# Patient Record
Sex: Female | Born: 1945
Health system: Southern US, Community
[De-identification: ages and names within clinical notes are randomized; demographics above are authoritative.]

## PROBLEM LIST (undated history)

## (undated) DIAGNOSIS — I739 Peripheral vascular disease, unspecified: Secondary | ICD-10-CM

## (undated) DIAGNOSIS — C801 Malignant (primary) neoplasm, unspecified: Secondary | ICD-10-CM

## (undated) DIAGNOSIS — J302 Other seasonal allergic rhinitis: Secondary | ICD-10-CM

## (undated) DIAGNOSIS — D649 Anemia, unspecified: Secondary | ICD-10-CM

## (undated) DIAGNOSIS — K631 Perforation of intestine (nontraumatic): Secondary | ICD-10-CM

## (undated) DIAGNOSIS — J449 Chronic obstructive pulmonary disease, unspecified: Secondary | ICD-10-CM

## (undated) DIAGNOSIS — C4491 Basal cell carcinoma of skin, unspecified: Secondary | ICD-10-CM

## (undated) DIAGNOSIS — I724 Aneurysm of artery of lower extremity: Secondary | ICD-10-CM

## (undated) DIAGNOSIS — T8859XA Other complications of anesthesia, initial encounter: Secondary | ICD-10-CM

## (undated) DIAGNOSIS — F329 Major depressive disorder, single episode, unspecified: Secondary | ICD-10-CM

## (undated) DIAGNOSIS — F32A Depression, unspecified: Secondary | ICD-10-CM

## (undated) DIAGNOSIS — K432 Incisional hernia without obstruction or gangrene: Secondary | ICD-10-CM

## (undated) DIAGNOSIS — T4145XA Adverse effect of unspecified anesthetic, initial encounter: Secondary | ICD-10-CM

## (undated) DIAGNOSIS — K5900 Constipation, unspecified: Secondary | ICD-10-CM

## (undated) HISTORY — DX: Basal cell carcinoma of skin, unspecified: C44.91

## (undated) HISTORY — DX: Malignant (primary) neoplasm, unspecified: C80.1

## (undated) HISTORY — DX: Chronic obstructive pulmonary disease, unspecified: J44.9

## (undated) HISTORY — PX: OTHER SURGICAL HISTORY: SHX169

## (undated) HISTORY — PX: HERNIA REPAIR: SHX51

## (undated) HISTORY — DX: Aneurysm of artery of lower extremity: I72.4

## (undated) HISTORY — PX: COLOSTOMY: SHX63

---

## 2003-12-14 ENCOUNTER — Inpatient Hospital Stay (HOSPITAL_COMMUNITY): Admission: EM | Admit: 2003-12-14 | Discharge: 2003-12-19 | Payer: Self-pay | Admitting: *Deleted

## 2012-05-30 ENCOUNTER — Emergency Department (HOSPITAL_COMMUNITY)
Admission: EM | Admit: 2012-05-30 | Discharge: 2012-05-30 | Disposition: A | Discharge status: Nursing Home (Includes ICF) | Payer: Self-pay | Source: Home / Self Care | Attending: Emergency Medicine | Admitting: Emergency Medicine

## 2012-05-30 ENCOUNTER — Encounter (HOSPITAL_COMMUNITY): Payer: Self-pay | Admitting: Emergency Medicine

## 2012-05-30 ENCOUNTER — Emergency Department (HOSPITAL_COMMUNITY)
Admission: EM | Admit: 2012-05-30 | Discharge: 2012-05-30 | Disposition: A | Discharge status: Home / Self Care | Payer: Self-pay | Attending: Emergency Medicine | Admitting: Emergency Medicine

## 2012-05-30 ENCOUNTER — Encounter (HOSPITAL_COMMUNITY): Payer: Self-pay | Admitting: *Deleted

## 2012-05-30 DIAGNOSIS — L03029 Acute lymphangitis of unspecified finger: Secondary | ICD-10-CM

## 2012-05-30 DIAGNOSIS — IMO0002 Reserved for concepts with insufficient information to code with codable children: Secondary | ICD-10-CM

## 2012-05-30 DIAGNOSIS — M659 Synovitis and tenosynovitis, unspecified: Secondary | ICD-10-CM

## 2012-05-30 HISTORY — DX: Perforation of intestine (nontraumatic): K63.1

## 2012-05-30 HISTORY — DX: Other seasonal allergic rhinitis: J30.2

## 2012-05-30 LAB — CBC WITH DIFFERENTIAL/PLATELET
Basophils Absolute: 0 10*3/uL (ref 0.0–0.1)
Basophils Relative: 0 % (ref 0–1)
Eosinophils Absolute: 0 10*3/uL (ref 0.0–0.7)
Hemoglobin: 14.8 g/dL (ref 12.0–15.0)
MCH: 31.8 pg (ref 26.0–34.0)
MCHC: 35.3 g/dL (ref 30.0–36.0)
Monocytes Relative: 6 % (ref 3–12)
Neutro Abs: 8 10*3/uL — ABNORMAL HIGH (ref 1.7–7.7)
Neutrophils Relative %: 76 % (ref 43–77)
Platelets: 196 10*3/uL (ref 150–400)
RDW: 12.6 % (ref 11.5–15.5)

## 2012-05-30 LAB — BASIC METABOLIC PANEL
BUN: 8 mg/dL (ref 6–23)
Chloride: 98 mEq/L (ref 96–112)
GFR calc Af Amer: >90 mL/min (ref >90)
GFR calc non Af Amer: 86 mL/min — ABNORMAL LOW (ref >90)
Potassium: 4 mEq/L (ref 3.5–5.1)

## 2012-05-30 MED ORDER — CLINDAMYCIN PHOSPHATE 600 MG/50ML IV SOLN
600.0000 mg | Freq: Once | INTRAVENOUS | Status: AC
  Administered 2012-05-30: 600 mg via INTRAVENOUS
  Filled 2012-05-30: qty 50

## 2012-05-30 MED ORDER — CLINDAMYCIN HCL 150 MG PO CAPS: 450.0000 mg | ORAL_CAPSULE | Freq: Three times a day (TID) | ORAL | Status: DC

## 2012-05-30 MED ORDER — OXYCODONE-ACETAMINOPHEN 5-325 MG PO TABS
2.0000 | ORAL_TABLET | Freq: Once | ORAL | Status: AC
  Administered 2012-05-30: 2 via ORAL
  Filled 2012-05-30: qty 2

## 2012-05-30 NOTE — ED Notes (Signed)
Clindamycin hung .

## 2012-05-30 NOTE — ED Notes (Signed)
Toe pain better after the digital block

## 2012-05-30 NOTE — ED Notes (Signed)
Pt with onset of right middle toe redness/swollen/painful x 2 weeks - swelling toes to ankle this week - left foot swollen x 2 days - right middle posterior toe with open sore/redness/swelling increased pain

## 2012-05-30 NOTE — ED Provider Notes (Signed)
History     CSN: [id number]  Arrival date & time 05/30/12  1136   First MD Initiated Contact with Patient 05/30/12 1137      Chief Complaint  Patient presents with  . Leg Swelling  . Toe Pain    (Consider location/radiation/quality/duration/timing/severity/associated sxs/prior treatment) HPI Comments: Patient presents as she is describing that about 2 weeks ago started with a swollen right middle toe that she described does not recall having sustained any injuries, falls or direct trauma to it. She describes a for the last 2 days her foot has become swollen on the lateral aspect of her foot all way down to her ankle. She denies any fevers, chills or changes in appetite. Just moving or touching her toe hurts a" whole lot".  Patient is a 66 y.o. female presenting with toe pain. The history is provided by the patient.  Toe Pain This is a recurrent problem. The current episode started more than 1 week ago. The problem occurs constantly. The problem has been gradually worsening. The symptoms are aggravated by walking ( touucching or presssure on her middle right toe). She has tried nothing for the symptoms.    Past Medical History  Diagnosis Date  . Seasonal allergies   . Perforated bowel     Past Surgical History  Procedure Date  . Hernia repair   . Colostomy   . Colostomy reversal      History reviewed. No pertinent family history.  History  Substance Use Topics  . Smoking status: Current Everyday Smoker  . Smokeless tobacco: Not on file  . Alcohol Use: No    OB History    Grav Para Term Preterm Abortions TAB SAB Ect Mult Living                  Review of Systems  Constitutional: Negative for fever and activity change.  Musculoskeletal: Positive for joint swelling. Negative for back pain, arthralgias and gait problem.  Skin: Positive for color change. Negative for pallor and wound.  Neurological: Negative for weakness and numbness.    Allergies  Review of  patient's allergies indicates no known allergies.  Home Medications   Current Outpatient Rx  Name Route Sig Dispense Refill  . ASPIRIN 325 MG PO TBEC Oral Take 325 mg by mouth daily.    Marland Kitchen OVER THE COUNTER MEDICATION  otc pain reliever unknow tylenol or ibuprofen      BP 167/79  Pulse 106  Temp 98.7 F (37.1 C) (Oral)  Resp 24  SpO2 94%  Physical Exam  Nursing note and vitals reviewed. Constitutional: She appears well-developed and well-nourished.  Musculoskeletal: She exhibits tenderness.       Feet:  Skin: No rash noted. There is erythema. No pallor.    ED Course  Procedures (including critical care time)  Labs Reviewed - No data to display No results found.   1. Felon with lymphangitis   2. Synovitis of toe       MDM  2 week history of a initially right mid toe infection (toe felon), Bolding and advancing to infectious tenosynovitis. Most likely patient will need extensive debridement of her distal toe and IV antibiotic management as I don't believe that clinically this infection is contained to her DIP only. Patient is afebrile, with moderate discomfort.        Jimmie Molly, MD 05/30/12 1311

## 2012-05-30 NOTE — ED Notes (Signed)
Pain no better since the percocet

## 2012-05-30 NOTE — ED Notes (Addendum)
Pt c/o right toe pain x 2 weeks ago. Pt presents with discoloration to right toe, pt does not recall recent injury. Pt sent from urgent care for further eval. Pt also reports bit by a cat on left foot. Pt has closed mark on top of left foot.

## 2012-05-30 NOTE — ED Notes (Addendum)
Pt reports pain to her (R) 3rd digit x2 weeks and (R) foot swelling x1 week. Pt seen at [facility] today and sent here for f/u. Pt describes her (R) toe pain as sharp and burning. Discoloration, swelling, and redness noted to (R) 3rd digit. Pt also reports (R) foot is cold "all the time." Pt also reports her cat bit her on the top of her (L) foot "a couple of days ago."

## 2012-05-30 NOTE — ED Provider Notes (Signed)
History     CSN: [id number]  Arrival date & time 05/30/12  1323   First MD Initiated Contact with Patient 05/30/12 1500      Chief Complaint  Patient presents with  . Toe Pain    (Consider location/radiation/quality/duration/timing/severity/associated sxs/prior treatment) HPI Comments: Patient reports that she has had pain and swelling of the distal portion of her right 3rd digit for the past 2 weeks, which is progressively worsening.  Area is painful to the touch.  She denies any injury or trauma.  She done anything to treat the area. She denies fever or chills.  She is able to move her toe without difficulty.  She has never had anything like this before.  She was seen at [facility] prior to arrival in the ED and was sent to the ED for possible IV antibiotics and possible debridement.    Patient is a 66 y.o. female presenting with toe pain. The history is provided by the patient.  Toe Pain Pertinent negatives include no chills, fever, nausea, numbness or vomiting.    Past Medical History  Diagnosis Date  . Seasonal allergies   . Perforated bowel     Past Surgical History  Procedure Date  . Hernia repair   . Colostomy   . Colostomy reversal      No family history on file.  History  Substance Use Topics  . Smoking status: Current Everyday Smoker  . Smokeless tobacco: Not on file  . Alcohol Use: No    OB History    Grav Para Term Preterm Abortions TAB SAB Ect Mult Living                  Review of Systems  Constitutional: Negative for fever and chills.  Gastrointestinal: Negative for nausea and vomiting.  Musculoskeletal: Negative for gait problem.  Skin: Positive for color change. Negative for wound.  Neurological: Negative for numbness.  All other systems reviewed and are negative.    Allergies  Review of patient's allergies indicates no known allergies.  Home Medications   Current Outpatient Rx  Name Route Sig Dispense Refill  . ASPIRIN 325 MG PO TBEC Oral  Take 325 mg by mouth every 4 (four) hours as needed. For pain    . OVER THE COUNTER MEDICATION Oral Take 1 tablet by mouth every 4 (four) hours as needed. otc pain reliever unknow tylenol or ibuprofen - for pain      BP 144/84  Pulse 110  Temp 99 F (37.2 C) (Oral)  Resp 18  SpO2 96%  Physical Exam  Nursing note and vitals reviewed. Constitutional: She appears well-developed and well-nourished. No distress.  HENT:  Head: Normocephalic and atraumatic.  Neck: Normal range of motion. Neck supple.  Cardiovascular: Normal rate, regular rhythm, normal heart sounds and intact distal pulses.   Pulmonary/Chest: Effort normal and breath sounds normal.  Musculoskeletal: Normal range of motion.       Full ROM of all toes on the right foot.  No pain with ROM.  No tenderness of swelling of the tendon.  Neurological: She is alert. No sensory deficit. Gait normal.       Distal sensation intact of the distal right 3rd toe  Skin: Skin is dry. She is not diaphoretic.       Erythema and swelling of the distal bottom of the right 3rd toe.  No swelling of the tendon appreciated.  Psychiatric: She has a normal mood and affect.    ED Course  Procedures (including critical care time)   Labs Reviewed  CBC WITH DIFFERENTIAL  BASIC METABOLIC PANEL   No results found.   No diagnosis found.  Patient discussed with Dr. Ranae Palms who also evaluated the patient.  INCISION AND DRAINAGE Performed by: Anne Shutter, Khamora Karan Consent: Verbal consent obtained. Risks and benefits: risks, benefits and alternatives were discussed Type: abscess Body area: right third toe  Anesthesia: Digital block  Local anesthetic: lidocaine 2% without epinephrine  Anesthetic total: 4 ml  Complexity: complex Blunt dissection to break up loculations  Drainage: none  Drainage amount: none  Patient tolerance: Patient tolerated the procedure well with no immediate complications.    MDM  Patient presents today with  pain and swelling of the distal third toe, consistent with a Felon.  Full ROM of the toe.  No pain with ROM.  Neurovascularly intact.  Patient given dose of IV Clindamycin while in the ED and discharged home with oral antibiotics.  Patient instructed to follow up with PCP.  Return precautions discussed with patient.          Pascal Lux [place], PA-C 05/31/12 1322

## 2012-05-30 NOTE — ED Notes (Signed)
Tray set up for  Surgical procedure

## 2012-05-30 NOTE — ED Notes (Signed)
Rt foot cold clammy white in color.palpable  Popiteal.  Temperature cool mid tibia then same temp as the lt leg .  She has pain there for 2 weks.  The pain id better if she dangles the foot

## 2012-05-31 NOTE — ED Provider Notes (Signed)
Medical screening examination/treatment/procedure(s) were conducted as a shared visit with non-physician practitioner(s) and myself.  I personally evaluated the patient during the encounter   Loren Racer, MD 05/31/12 1505

## 2012-06-03 ENCOUNTER — Emergency Department (HOSPITAL_COMMUNITY): Payer: Self-pay

## 2012-06-03 ENCOUNTER — Observation Stay (HOSPITAL_COMMUNITY)
Admission: EM | Admit: 2012-06-03 | Discharge: 2012-06-03 | Disposition: A | Discharge status: Home / Self Care | Payer: Self-pay | Attending: Emergency Medicine | Admitting: Emergency Medicine

## 2012-06-03 ENCOUNTER — Encounter (HOSPITAL_COMMUNITY): Payer: Self-pay | Admitting: Emergency Medicine

## 2012-06-03 DIAGNOSIS — L0390 Cellulitis, unspecified: Secondary | ICD-10-CM

## 2012-06-03 DIAGNOSIS — F17200 Nicotine dependence, unspecified, uncomplicated: Secondary | ICD-10-CM | POA: Insufficient documentation

## 2012-06-03 DIAGNOSIS — L02619 Cutaneous abscess of unspecified foot: Principal | ICD-10-CM | POA: Insufficient documentation

## 2012-06-03 DIAGNOSIS — L03039 Cellulitis of unspecified toe: Principal | ICD-10-CM | POA: Insufficient documentation

## 2012-06-03 LAB — COMPREHENSIVE METABOLIC PANEL
ALT: 17 U/L (ref 0–35)
AST: 18 U/L (ref 0–37)
Albumin: 4 g/dL (ref 3.5–5.2)
Alkaline Phosphatase: 90 U/L (ref 39–117)
CO2: 28 mEq/L (ref 19–32)
Chloride: 93 mEq/L — ABNORMAL LOW (ref 96–112)
Potassium: 3.8 mEq/L (ref 3.5–5.1)
Total Bilirubin: 0.3 mg/dL (ref 0.3–1.2)

## 2012-06-03 LAB — CBC WITH DIFFERENTIAL/PLATELET
Basophils Absolute: 0 10*3/uL (ref 0.0–0.1)
Basophils Relative: 0 % (ref 0–1)
Hemoglobin: 13.6 g/dL (ref 12.0–15.0)
Lymphocytes Relative: 20 % (ref 12–46)
MCHC: 35.3 g/dL (ref 30.0–36.0)
Monocytes Relative: 6 % (ref 3–12)
Neutro Abs: 6.1 10*3/uL (ref 1.7–7.7)
Neutrophils Relative %: 74 % (ref 43–77)
RDW: 12.3 % (ref 11.5–15.5)
WBC: 8.3 10*3/uL (ref 4.0–10.5)

## 2012-06-03 MED ORDER — ONDANSETRON HCL 4 MG/2ML IJ SOLN: 4.0000 mg | Freq: Four times a day (QID) | INTRAMUSCULAR | Status: DC | PRN

## 2012-06-03 MED ORDER — HYDROCODONE-ACETAMINOPHEN 5-325 MG PO TABS: 1.0000 | ORAL_TABLET | ORAL | Status: DC | PRN

## 2012-06-03 MED ORDER — FUROSEMIDE 20 MG PO TABS: 20.0000 mg | ORAL_TABLET | Freq: Two times a day (BID) | ORAL | Status: DC

## 2012-06-03 MED ORDER — CEFAZOLIN SODIUM 1-5 GM-% IV SOLN
1.0000 g | Freq: Once | INTRAVENOUS | Status: AC
  Administered 2012-06-03: 1 g via INTRAVENOUS
  Filled 2012-06-03: qty 50

## 2012-06-03 MED ORDER — SODIUM CHLORIDE 0.9 % IV SOLN: 20.0000 mL | INTRAVENOUS | Status: DC

## 2012-06-03 MED ORDER — ACETAMINOPHEN 325 MG PO TABS: 650.0000 mg | ORAL_TABLET | ORAL | Status: DC | PRN

## 2012-06-03 MED ORDER — MORPHINE SULFATE 4 MG/ML IJ SOLN: 4.0000 mg | INTRAMUSCULAR | Status: DC | PRN

## 2012-06-03 MED ORDER — ZOLPIDEM TARTRATE 5 MG PO TABS: 5.0000 mg | ORAL_TABLET | Freq: Every evening | ORAL | Status: DC | PRN

## 2012-06-03 MED ORDER — CEPHALEXIN 500 MG PO CAPS: 500.0000 mg | ORAL_CAPSULE | Freq: Four times a day (QID) | ORAL | Status: DC

## 2012-06-03 MED ORDER — CEFAZOLIN SODIUM 1-5 GM-% IV SOLN
1.0000 g | Freq: Three times a day (TID) | INTRAVENOUS | Status: DC
  Administered 2012-06-03: 1 g via INTRAVENOUS
  Filled 2012-06-03: qty 50

## 2012-06-03 NOTE — ED Notes (Signed)
Meal ordered

## 2012-06-03 NOTE — ED Provider Notes (Signed)
History  This chart was scribed for Anna Booze, MD by Shari Heritage. The patient was seen in room TR07C/TR07C. Patient's care was started at 1100.     CSN: [id number]  Arrival date & time 06/03/12  1100   First MD Initiated Contact with Patient 06/03/12 1148      Chief Complaint  Patient presents with  . Toe Pain    The history is provided by the patient. No language interpreter was used.    ANDREY HOOBLER is a 66 y.o. female who presents to the Emergency Department complaining of mild to moderate, right third, 4th and 5th right toe pain onset 3 weeks ago. Patient describes pain as intermittent, short and burning. There is associated redness and swelling. Patient states that the pain started in her 3rd toe and there is now pain in her 4th and 5th toes as well. She rates her current pain at 4/10, but says that at the worst it has been 10/10. Patient denies aggravating factors. She is ambulatory. Patient has been taking Clindamycin for an infection to her 3rd right toe under the nail. She says that infection has seemed unresponsive to the antibiotics. She has a history of perforated bowel. Patient is a current smoker (1 pack/day).   PCP - None   Past Medical History  Diagnosis Date  . Seasonal allergies   . Perforated bowel     Past Surgical History  Procedure Date  . Hernia repair   . Colostomy   . Colostomy reversal      History reviewed. No pertinent family history.  History  Substance Use Topics  . Smoking status: Current Everyday Smoker  . Smokeless tobacco: Not on file  . Alcohol Use: No    OB History    Grav Para Term Preterm Abortions TAB SAB Ect Mult Living                  Review of Systems  Musculoskeletal:       Positive for right toe pain.  All other systems reviewed and are negative.    Allergies  Review of patient's allergies indicates no known allergies.  Home Medications   Current Outpatient Rx  Name Route Sig Dispense Refill  .  ACETAMINOPHEN 500 MG PO TABS Oral Take 500 mg by mouth every 6 (six) hours as needed. For pain    . CLINDAMYCIN HCL 150 MG PO CAPS Oral Take 3 capsules (450 mg total) by mouth 3 (three) times daily. 90 capsule 0  . ASPIRIN 325 MG PO TBEC Oral Take 325 mg by mouth every 4 (four) hours as needed. For pain      BP 154/77  Pulse 106  Temp 98.4 F (36.9 C) (Oral)  Resp 20  SpO2 98%  Physical Exam  Constitutional: She is oriented to person, place, and time. She appears well-developed and well-nourished.  HENT:  Head: Normocephalic and atraumatic.  Musculoskeletal:       2+ pedal and pretibial edema bilaterally. Right foot has moderate erythema. Erythema and swelling present on the right 3rd, 4th and 5th toes. Right 3rd toe has a debrided blister present at the tip without drainage. Capillary refill is prompt.   Neurological: She is alert and oriented to person, place, and time.       Right ptosis.  Psychiatric: She has a normal mood and affect. Her behavior is normal.    ED Course  Procedures (including critical care time) DIAGNOSTIC STUDIES: Oxygen Saturation is 98% on room  air, normal by my interpretation.    COORDINATION OF CARE: 11:51am- Patient informed of current plan for treatment and evaluation and agrees with plan at this time. Will order an x-ray of right foot, CBC, comprehensive metabolic panel and sedimentation rate test. Will administer Ancef by IV.  Results for orders placed during the hospital encounter of 06/03/12  CBC WITH DIFFERENTIAL      Component Value Range   WBC 8.3  4.0 - 10.5 K/uL   RBC 4.31  3.87 - 5.11 MIL/uL   Hemoglobin 13.6  12.0 - 15.0 g/dL   HCT 16.1  09.6 - 04.5 %   MCV 89.3  78.0 - 100.0 fL   MCH 31.6  26.0 - 34.0 pg   MCHC 35.3  30.0 - 36.0 g/dL   RDW 40.9  81.1 - 91.4 %   Platelets 181  150 - 400 K/uL   Neutrophils Relative 74  43 - 77 %   Neutro Abs 6.1  1.7 - 7.7 K/uL   Lymphocytes Relative 20  12 - 46 %   Lymphs Abs 1.6  0.7 - 4.0 K/uL    Monocytes Relative 6  3 - 12 %   Monocytes Absolute 0.5  0.1 - 1.0 K/uL   Eosinophils Relative 0  0 - 5 %   Eosinophils Absolute 0.0  0.0 - 0.7 K/uL   Basophils Relative 0  0 - 1 %   Basophils Absolute 0.0  0.0 - 0.1 K/uL  COMPREHENSIVE METABOLIC PANEL      Component Value Range   Sodium 131 (*) 135 - 145 mEq/L   Potassium 3.8  3.5 - 5.1 mEq/L   Chloride 93 (*) 96 - 112 mEq/L   CO2 28  19 - 32 mEq/L   Glucose, Bld 103 (*) 70 - 99 mg/dL   BUN 6  6 - 23 mg/dL   Creatinine, Ser 7.82  0.50 - 1.10 mg/dL   Calcium 9.3  8.4 - 95.6 mg/dL   Total Protein 7.5  6.0 - 8.3 g/dL   Albumin 4.0  3.5 - 5.2 g/dL   AST 18  0 - 37 U/L   ALT 17  0 - 35 U/L   Alkaline Phosphatase 90  39 - 117 U/L   Total Bilirubin 0.3  0.3 - 1.2 mg/dL   GFR calc non Af Amer 87 (*) >90 mL/min   GFR calc Af Amer >90  >90 mL/min  SEDIMENTATION RATE      Component Value Range   Sed Rate 11  0 - 22 mm/hr   Dg Foot Complete Right  06/03/2012  *RADIOLOGY REPORT*  Clinical Data: Pain and swelling fourth toe  RIGHT FOOT COMPLETE - 3+ VIEW  Comparison: None.  Findings: Three views of the right foot submitted.  No acute fracture or subluxation.  Spurring noted at the base of fifth metatarsal.  No periosteal reaction or bony erosion.  IMPRESSION: No acute fracture or subluxation.  Original Report Authenticated By: Natasha Mead, M.D.      1. Cellulitis       MDM  Cellulitis in the right fourth toe in spite of recent incision and drainage of abscess. She also is demonstrating significant peripheral edema. She is felt treatment and had MRSA, so she is started on Ancef will be placed in CDU under cellulitis protocol. She'll need diuretics for her peripheral edema.      I personally performed the services described in this documentation, which was scribed in my presence. The recorded  information has been reviewed and considered.      Anna Booze, MD 06/03/12 2044

## 2012-06-03 NOTE — ED Notes (Signed)
Pt taken to xray 

## 2012-06-03 NOTE — ED Provider Notes (Signed)
Medical screening examination/treatment/procedure(s) were conducted as a shared visit with non-physician practitioner(s) and myself.  I personally evaluated the patient during the encounter   Dione Booze, MD 06/03/12 2032

## 2012-06-03 NOTE — ED Notes (Signed)
Pt ambulated to bathroom 

## 2012-06-03 NOTE — ED Notes (Signed)
Pt c/o increasing pain in 3rd digit on right foot; pt sts taking antibiotics

## 2012-06-03 NOTE — ED Provider Notes (Signed)
The patient has had no complaint of pain while in CDU. She sates that she does not want to stay longer than for the second dose of antibiotics. Food is red and moderately swollen. Discussed we would observe until second dose and re-evaluate.  20:00 - foot swelling is improved. Redness is less pronounced and does not extend beyond the marked borders. Second dose Ancef is being given. Feel patient can go home on Keflex. Will give Lasix for mild peripheral edema noted by Dr. Preston Fleeting. Patient has no PCP. She will return here in 2 days for recheck of infection.   Rodena Medin, PA-C 06/03/12 2007

## 2012-06-03 NOTE — ED Notes (Signed)
Reviewed discharge inst several times.  Voiced understanding.  Needs to follow up here in 2 days stated "I will be here I don't want the police to come and get me."  Reviewed prescriptions with patient

## 2012-06-05 ENCOUNTER — Emergency Department (HOSPITAL_COMMUNITY)
Admission: EM | Admit: 2012-06-05 | Discharge: 2012-06-05 | Disposition: A | Discharge status: Home / Self Care | Payer: Self-pay | Attending: Emergency Medicine | Admitting: Emergency Medicine

## 2012-06-05 ENCOUNTER — Encounter (HOSPITAL_COMMUNITY): Payer: Self-pay | Admitting: Emergency Medicine

## 2012-06-05 DIAGNOSIS — Z5189 Encounter for other specified aftercare: Secondary | ICD-10-CM

## 2012-06-05 DIAGNOSIS — F17200 Nicotine dependence, unspecified, uncomplicated: Secondary | ICD-10-CM | POA: Insufficient documentation

## 2012-06-05 DIAGNOSIS — Z4801 Encounter for change or removal of surgical wound dressing: Secondary | ICD-10-CM | POA: Insufficient documentation

## 2012-06-05 NOTE — ED Notes (Signed)
Pt presents for re-evaluation of cellulitis to both lower legs.  Pt has remnants of skin marker with no redness noted.  No swelling noted, pt reports compliance with abx, unable to tolerate pain medication due to dizziness and nausea.

## 2012-06-05 NOTE — ED Notes (Signed)
Pt here for recheck on toes on right foot; noted to be red; pt sts taking antibiotics but sts not able to tolerate pain meds

## 2012-06-05 NOTE — ED Provider Notes (Signed)
History   This chart was scribed for Anna Kras, MD by Toya Smothers. The patient was seen in room TR05C/TR05C. Patient's care was started at 1146.  CSN: [id number]  Arrival date & time 06/05/12  1146   First MD Initiated Contact with Patient 06/05/12 1234      Chief Complaint  Patient presents with  . Wound Check   Patient is a 66 y.o. female presenting with wound check. The history is provided by the patient. No language interpreter was used.  Wound Check    Anna Barrera is a 66 y.o. female presents to the ED for wound check. Pt was diagnosed with a skin infection on week ago. Pt has been taking medication and states that pain has significantly decreased and symptoms are less prominent. Pt is feeling better. She denies fever.   Past Medical History  Diagnosis Date  . Seasonal allergies   . Perforated bowel     Past Surgical History  Procedure Date  . Hernia repair   . Colostomy   . Colostomy reversal      History reviewed. No pertinent family history.  History  Substance Use Topics  . Smoking status: Current Everyday Smoker  . Smokeless tobacco: Not on file  . Alcohol Use: No   Review of Systems  Skin:       Wound Check  All other systems reviewed and are negative.    Allergies  Vicodin  Home Medications   Current Outpatient Rx  Name Route Sig Dispense Refill  . ACETAMINOPHEN 500 MG PO TABS Oral Take 500 mg by mouth every 6 (six) hours as needed. For pain    . ASPIRIN 325 MG PO TBEC Oral Take 325 mg by mouth every 4 (four) hours as needed. For pain    . CEPHALEXIN 500 MG PO CAPS Oral Take 500 mg by mouth 4 (four) times daily.    . FUROSEMIDE 20 MG PO TABS Oral Take 20 mg by mouth 2 (two) times daily.      BP 121/92  Pulse 122  Temp 98.6 F (37 C) (Oral)  Resp 20  Ht 5\' 2"  (1.575 m)  Wt 120 lb (54.432 kg)  BMI 21.95 kg/m2  SpO2 98%  Physical Exam  Nursing note and vitals reviewed. Constitutional: She appears well-developed and  well-nourished. No distress.  HENT:  Head: Normocephalic and atraumatic.  Right Ear: External ear normal.  Left Ear: External ear normal.  Eyes: Conjunctivae are normal. Right eye exhibits no discharge. Left eye exhibits no discharge. No scleral icterus.  Neck: Neck supple. No tracheal deviation present.  Cardiovascular: Normal rate.   Pulmonary/Chest: Effort normal. No stridor. No respiratory distress.  Musculoskeletal: She exhibits no edema.       R lower extremity. Area of cellulitis denoted with marker. Erythema has receded with decreased warmth.  Neurological: She is alert. Cranial nerve deficit: no gross deficits.  Skin: Skin is warm and dry. No rash noted.  Psychiatric: She has a normal mood and affect.    ED Course  Procedures (including critical care time) DIAGNOSTIC STUDIES: Oxygen Saturation is 98% on room air, normal by my interpretation.    COORDINATION OF CARE: 1245- evaluated Pt. Pt is without distress and feeling better.   Labs Reviewed - No data to display Dg Foot Complete Right  06/03/2012  *RADIOLOGY REPORT*  Clinical Data: Pain and swelling fourth toe  RIGHT FOOT COMPLETE - 3+ VIEW  Comparison: None.  Findings: Three views of the right  foot submitted.  No acute fracture or subluxation.  Spurring noted at the base of fifth metatarsal.  No periosteal reaction or bony erosion.  IMPRESSION: No acute fracture or subluxation.  Original Report Authenticated By: Natasha Mead, M.D.     1. Visit for wound check       MDM  The patient cellulitis seems to be improving significantly. At this point we'll have her followup with her primary doctor routinely.   I personally performed the services described in this documentation, which was scribed in my presence.  The recorded information has been reviewed and considered.       Anna Kras, MD 06/05/12 [phone]

## 2012-06-09 ENCOUNTER — Encounter (HOSPITAL_COMMUNITY): Payer: Self-pay | Admitting: *Deleted

## 2012-06-09 ENCOUNTER — Emergency Department (HOSPITAL_COMMUNITY): Payer: Self-pay

## 2012-06-09 ENCOUNTER — Inpatient Hospital Stay (HOSPITAL_COMMUNITY)
Admission: EM | Admit: 2012-06-09 | Discharge: 2012-06-12 | DRG: 300 | Disposition: A | Discharge status: Home / Self Care | Payer: 59 | Attending: Family Medicine | Admitting: Family Medicine

## 2012-06-09 DIAGNOSIS — F17200 Nicotine dependence, unspecified, uncomplicated: Secondary | ICD-10-CM | POA: Diagnosis present

## 2012-06-09 DIAGNOSIS — L02619 Cutaneous abscess of unspecified foot: Secondary | ICD-10-CM | POA: Diagnosis present

## 2012-06-09 DIAGNOSIS — Z803 Family history of malignant neoplasm of breast: Secondary | ICD-10-CM

## 2012-06-09 DIAGNOSIS — Z885 Allergy status to narcotic agent status: Secondary | ICD-10-CM

## 2012-06-09 DIAGNOSIS — J309 Allergic rhinitis, unspecified: Secondary | ICD-10-CM | POA: Diagnosis present

## 2012-06-09 DIAGNOSIS — L089 Local infection of the skin and subcutaneous tissue, unspecified: Secondary | ICD-10-CM

## 2012-06-09 DIAGNOSIS — Z23 Encounter for immunization: Secondary | ICD-10-CM

## 2012-06-09 DIAGNOSIS — J4489 Other specified chronic obstructive pulmonary disease: Secondary | ICD-10-CM | POA: Diagnosis present

## 2012-06-09 DIAGNOSIS — Z7982 Long term (current) use of aspirin: Secondary | ICD-10-CM

## 2012-06-09 DIAGNOSIS — Z01810 Encounter for preprocedural cardiovascular examination: Secondary | ICD-10-CM

## 2012-06-09 DIAGNOSIS — L97509 Non-pressure chronic ulcer of other part of unspecified foot with unspecified severity: Secondary | ICD-10-CM | POA: Diagnosis present

## 2012-06-09 DIAGNOSIS — I739 Peripheral vascular disease, unspecified: Principal | ICD-10-CM | POA: Diagnosis present

## 2012-06-09 DIAGNOSIS — I75029 Atheroembolism of unspecified lower extremity: Secondary | ICD-10-CM | POA: Diagnosis present

## 2012-06-09 DIAGNOSIS — L03039 Cellulitis of unspecified toe: Secondary | ICD-10-CM | POA: Diagnosis present

## 2012-06-09 DIAGNOSIS — J449 Chronic obstructive pulmonary disease, unspecified: Secondary | ICD-10-CM | POA: Diagnosis present

## 2012-06-09 DIAGNOSIS — Z720 Tobacco use: Secondary | ICD-10-CM

## 2012-06-09 DIAGNOSIS — L03119 Cellulitis of unspecified part of limb: Secondary | ICD-10-CM

## 2012-06-09 DIAGNOSIS — I998 Other disorder of circulatory system: Secondary | ICD-10-CM | POA: Diagnosis present

## 2012-06-09 DIAGNOSIS — I771 Stricture of artery: Secondary | ICD-10-CM

## 2012-06-09 DIAGNOSIS — R509 Fever, unspecified: Secondary | ICD-10-CM

## 2012-06-09 DIAGNOSIS — Z9049 Acquired absence of other specified parts of digestive tract: Secondary | ICD-10-CM

## 2012-06-09 DIAGNOSIS — R0989 Other specified symptoms and signs involving the circulatory and respiratory systems: Secondary | ICD-10-CM | POA: Diagnosis present

## 2012-06-09 DIAGNOSIS — L98499 Non-pressure chronic ulcer of skin of other sites with unspecified severity: Principal | ICD-10-CM | POA: Diagnosis present

## 2012-06-09 LAB — POCT I-STAT, CHEM 8
Chloride: 101 mEq/L (ref 96–112)
Creatinine, Ser: 0.9 mg/dL (ref 0.50–1.10)
HCT: 40 % (ref 36.0–46.0)
Hemoglobin: 13.6 g/dL (ref 12.0–15.0)
Potassium: 3.9 mEq/L (ref 3.5–5.1)
Sodium: 140 mEq/L (ref 135–145)

## 2012-06-09 LAB — CBC WITH DIFFERENTIAL/PLATELET
Basophils Absolute: 0 10*3/uL (ref 0.0–0.1)
Basophils Relative: 1 % (ref 0–1)
Eosinophils Absolute: 0.1 10*3/uL (ref 0.0–0.7)
Eosinophils Relative: 1 % (ref 0–5)
HCT: 38.8 % (ref 36.0–46.0)
Lymphocytes Relative: 25 % (ref 12–46)
MCH: 31.1 pg (ref 26.0–34.0)
MCHC: 34 g/dL (ref 30.0–36.0)
MCV: 91.5 fL (ref 78.0–100.0)
Monocytes Absolute: 0.6 10*3/uL (ref 0.1–1.0)
RDW: 12.8 % (ref 11.5–15.5)

## 2012-06-09 MED ORDER — SODIUM CHLORIDE 0.9 % IV SOLN
INTRAVENOUS | Status: DC
  Administered 2012-06-10: via INTRAVENOUS

## 2012-06-09 MED ORDER — PIPERACILLIN-TAZOBACTAM 3.375 G IVPB
3.3750 g | Freq: Once | INTRAVENOUS | Status: AC
  Administered 2012-06-10: 3.375 g via INTRAVENOUS
  Filled 2012-06-09: qty 50

## 2012-06-09 MED ORDER — VANCOMYCIN HCL IN DEXTROSE 1-5 GM/200ML-% IV SOLN
1000.0000 mg | Freq: Once | INTRAVENOUS | Status: AC
  Administered 2012-06-10: 1000 mg via INTRAVENOUS
  Filled 2012-06-09: qty 200

## 2012-06-09 NOTE — ED Provider Notes (Signed)
History     CSN: [id number]  Arrival date & time 06/09/12  1229   First MD Initiated Contact with Patient 06/09/12 2208      Chief Complaint  Patient presents with  . Foot Pain    (Consider location/radiation/quality/duration/timing/severity/associated sxs/prior treatment) HPI Comments: Anna Barrera is a 66 y.o. Female who is here for her third visit for right toe pain, infection, swelling foot. She is currently taking Keflex. She was on clindamycin, but it was changed. She has no primary care Dr. She denies fever, chills, nausea, vomiting, chest pain, with this or dizziness. The pain is worse with standing, and it hurts to touch the right foot. There are no palliative factors.  Patient is a 66 y.o. female presenting with lower extremity pain. The history is provided by the patient.  Foot Pain    Past Medical History  Diagnosis Date  . Seasonal allergies   . Perforated bowel   . No pertinent past medical history     Past Surgical History  Procedure Date  . Hernia repair   . Colostomy   . Colostomy reversal      Family History  Problem Relation Age of Onset  . Breast cancer Mother     History  Substance Use Topics  . Smoking status: Current Everyday Smoker  . Smokeless tobacco: Not on file  . Alcohol Use: No    OB History    Grav Para Term Preterm Abortions TAB SAB Ect Mult Living                  Review of Systems  All other systems reviewed and are negative.    Allergies  Vicodin  Home Medications   No current outpatient prescriptions on file.  BP 100/45  Pulse 88  Temp 98.7 F (37.1 C) (Oral)  Resp 18  Ht 5\' 2"  (1.575 m)  Wt 120 lb (54.432 kg)  BMI 21.95 kg/m2  SpO2 95%  Physical Exam  Nursing note and vitals reviewed. Constitutional: She is oriented to person, place, and time. She appears well-developed and well-nourished.  HENT:  Head: Normocephalic and atraumatic.  Eyes: Conjunctivae and EOM are normal. Pupils are equal,  round, and reactive to light.  Neck: Normal range of motion and phonation normal. Neck supple.  Cardiovascular: Normal rate, regular rhythm and intact distal pulses.        Normal S1, S2. No murmur. No rub. There are no palpable pulses in the right foot. With Doppler interrogation: there is a biphasic posterior tibial artery pulse, but no dorsalis pedis pulse. In the right groin. There is a biphasic femoral pulse with Doppler.  Pulmonary/Chest: Effort normal and breath sounds normal. She has no wheezes. She has no rales. She exhibits no tenderness.  Abdominal: Soft. She exhibits no distension. There is no tenderness. There is no guarding.  Musculoskeletal:       Right foot is edematous. Toes 1,2,3,4, right foot have purplish discoloration, consistent with an ischemic appearance. Toes 2 and 3 have distal defects, consisting of open wound. There is no purulent drainage. There is vague discoloration, redness, of the right foot. The right calf and knee are normal.  Neurological: She is alert and oriented to person, place, and time. She has normal strength. No cranial nerve deficit. She exhibits normal muscle tone. Coordination normal.  Skin: Skin is warm and dry.  Psychiatric: She has a normal mood and affect. Her behavior is normal. Judgment and thought content normal.  ED Course  Procedures (including critical care time)  Emergency department treatment: IV fluids, IV Zosyn.   Labs Reviewed  POCT I-STAT, CHEM 8 - Abnormal; Notable for the following:    BUN 4 (*)     All other components within normal limits  COMPREHENSIVE METABOLIC PANEL - Abnormal; Notable for the following:    Glucose, Bld 134 (*)     Albumin 3.3 (*)     GFR calc non Af Amer 69 (*)     GFR calc Af Amer 80 (*)     All other components within normal limits  CBC WITH DIFFERENTIAL - Abnormal; Notable for the following:    RBC 3.78 (*)     HCT 34.6 (*)     All other components within normal limits  URINALYSIS, ROUTINE W  REFLEX MICROSCOPIC - Abnormal; Notable for the following:    Specific Gravity, Urine 1.043 (*)     Hgb urine dipstick SMALL (*)     Ketones, ur 15 (*)     Leukocytes, UA SMALL (*)     All other components within normal limits  URINE MICROSCOPIC-ADD ON - Abnormal; Notable for the following:    Squamous Epithelial / LPF FEW (*)     All other components within normal limits  CBC WITH DIFFERENTIAL  CBC  CULTURE, BLOOD (ROUTINE X 2)  CULTURE, BLOOD (ROUTINE X 2)  URINE CULTURE   Dg Chest 2 View  06/11/2012  *RADIOLOGY REPORT*  Clinical Data: Fever, crackles  CHEST - 2 VIEW  Comparison: Chest x-ray of 08/13 1013  Findings: There is minimal linear atelectasis at the right lung base.  No infiltrate or effusion is seen.  Mediastinal contours appear stable.  The heart is within normal limits in size.  No bony abnormality is seen.  Surgical clips are present in the right upper quadrant from prior cholecystectomy.  IMPRESSION: Mild linear atelectasis at the right lung base.  No active lung disease.  Original Report Authenticated By: Juline Patch, M.D.   Nm Myocar Multi W/spect W/wall Motion / Ef  06/11/2012  Clinical Data:  Tobacco use.  Preoperative assessment.  Technique:  Standard myocardial SPECT imaging performed after resting intravenous injection of Tc-12m tetrofosmin.  Subsequently, stress in the form of Lexiscan  was administered under the supervision of the Cardiology staff.  At peak stress, Tc-24m tetrofosmin was injected intravenously and standard myocardial SPECT imaging performed.  Quantitative gated imaging also performed to evaluate left ventricular wall motion and estimate left ventricular ejection fraction.  Radiopharmaceutical: Tc-74m tetrofosmin, 10 mCi at rest and 30 mCi at stress.  Comparison:  None  MYOCARDIAL IMAGING WITH SPECT (REST AND STRESS)  Findings:  Reduced inferior wall activity on stress and rest imaging may well be due to diaphragmatic attenuation although scar can have a  similar appearance.  There is considerable activity in bowel along the left hemidiaphragm, which do not clear even with oral water administration.  There is also considerable breathing motion artifact.  LEFT VENTRICULAR EJECTION FRACTION  Findings:  Left ventricular end-diastolic volume is 41 cc.  End- systolic volume is 19 cc.  Derived LV ejection fraction is 54%.  GATED LEFT VENTRICULAR WALL MOTION STUDY  Findings:  Equivocal hypokinesis along the inferolateral cardiac base noted.  IMPRESSION:  1.  Reduced inferior wall activity on stress rest imaging, probably from diaphragmatic attenuation and less likely due to scar.  No inducible ischemia observed.  Original Report Authenticated By: Dellia Cloud, M.D.  1. Foot infection   2. Arterial insufficiency with ischemic ulcer   3. Cellulitis of foot   4. Tobacco abuse   5. Pre-operative cardiovascular examination   6. Fever       MDM  Right foot infection with appearance of ischemia and diminished arterial flow, hold right leg. No cardiac irregularity. I have low suspicion for embolic etiology of infection. Patient has failed outpatient treatment and will need to be admitted for further management. She's had a formal arterial testing with ankle-brachial indexes, bilaterally.    Plan: Admit- unassigned        Flint Melter, MD 06/12/12 [phone]

## 2012-06-09 NOTE — ED Notes (Signed)
PT is here with right middle toe back at tip and redness going up leg.  Pt states the redness is new.  Pt is on antibiotics for this.  Pulse present in extremity

## 2012-06-09 NOTE — ED Notes (Signed)
Advised of the wait 

## 2012-06-10 ENCOUNTER — Encounter (HOSPITAL_COMMUNITY): Payer: Self-pay | Admitting: Internal Medicine

## 2012-06-10 ENCOUNTER — Encounter (HOSPITAL_COMMUNITY)
Admission: EM | Disposition: A | Discharge status: Home / Self Care | Payer: Self-pay | Source: Home / Self Care | Attending: Family Medicine

## 2012-06-10 ENCOUNTER — Other Ambulatory Visit (HOSPITAL_COMMUNITY): Payer: Self-pay

## 2012-06-10 ENCOUNTER — Inpatient Hospital Stay (HOSPITAL_COMMUNITY): Payer: MEDICAID

## 2012-06-10 DIAGNOSIS — I739 Peripheral vascular disease, unspecified: Secondary | ICD-10-CM

## 2012-06-10 DIAGNOSIS — M79609 Pain in unspecified limb: Secondary | ICD-10-CM

## 2012-06-10 DIAGNOSIS — R6889 Other general symptoms and signs: Secondary | ICD-10-CM

## 2012-06-10 DIAGNOSIS — Z01810 Encounter for preprocedural cardiovascular examination: Secondary | ICD-10-CM

## 2012-06-10 DIAGNOSIS — I771 Stricture of artery: Secondary | ICD-10-CM

## 2012-06-10 DIAGNOSIS — L98499 Non-pressure chronic ulcer of skin of other sites with unspecified severity: Secondary | ICD-10-CM

## 2012-06-10 DIAGNOSIS — Z720 Tobacco use: Secondary | ICD-10-CM | POA: Diagnosis present

## 2012-06-10 DIAGNOSIS — F17200 Nicotine dependence, unspecified, uncomplicated: Secondary | ICD-10-CM

## 2012-06-10 DIAGNOSIS — L02619 Cutaneous abscess of unspecified foot: Secondary | ICD-10-CM

## 2012-06-10 DIAGNOSIS — L97909 Non-pressure chronic ulcer of unspecified part of unspecified lower leg with unspecified severity: Secondary | ICD-10-CM

## 2012-06-10 DIAGNOSIS — L03119 Cellulitis of unspecified part of limb: Secondary | ICD-10-CM | POA: Diagnosis present

## 2012-06-10 HISTORY — PX: LOWER EXTREMITY ANGIOGRAM: SHX5508

## 2012-06-10 LAB — CBC WITH DIFFERENTIAL/PLATELET
Basophils Absolute: 0.1 10*3/uL (ref 0.0–0.1)
Lymphs Abs: 2 10*3/uL (ref 0.7–4.0)
MCH: 32 pg (ref 26.0–34.0)
MCV: 91.5 fL (ref 78.0–100.0)
Monocytes Absolute: 0.6 10*3/uL (ref 0.1–1.0)
Platelets: 213 10*3/uL (ref 150–400)
RDW: 13 % (ref 11.5–15.5)

## 2012-06-10 LAB — COMPREHENSIVE METABOLIC PANEL
AST: 37 U/L (ref 0–37)
Albumin: 3.3 g/dL — ABNORMAL LOW (ref 3.5–5.2)
Alkaline Phosphatase: 71 U/L (ref 39–117)
BUN: 7 mg/dL (ref 6–23)
Chloride: 102 mEq/L (ref 96–112)
Potassium: 3.7 mEq/L (ref 3.5–5.1)
Total Bilirubin: 0.4 mg/dL (ref 0.3–1.2)

## 2012-06-10 SURGERY — ANGIOGRAM, LOWER EXTREMITY
Anesthesia: LOCAL | Laterality: Left

## 2012-06-10 MED ORDER — MIDAZOLAM HCL 2 MG/2ML IJ SOLN
INTRAMUSCULAR | Status: AC
  Filled 2012-06-10: qty 2

## 2012-06-10 MED ORDER — ASPIRIN EC 81 MG PO TBEC
81.0000 mg | DELAYED_RELEASE_TABLET | Freq: Every day | ORAL | Status: DC
  Administered 2012-06-10 – 2012-06-12 (×3): 81 mg via ORAL
  Filled 2012-06-10 (×5): qty 1

## 2012-06-10 MED ORDER — LABETALOL HCL 5 MG/ML IV SOLN
10.0000 mg | INTRAVENOUS | Status: DC | PRN
  Filled 2012-06-10: qty 4

## 2012-06-10 MED ORDER — SODIUM CHLORIDE 0.9 % IV SOLN: INTRAVENOUS | Status: DC

## 2012-06-10 MED ORDER — SIMVASTATIN 20 MG PO TABS
20.0000 mg | ORAL_TABLET | Freq: Every day | ORAL | Status: DC
  Administered 2012-06-10 – 2012-06-11 (×2): 20 mg via ORAL
  Filled 2012-06-10 (×3): qty 1

## 2012-06-10 MED ORDER — ONDANSETRON HCL 4 MG PO TABS: 4.0000 mg | ORAL_TABLET | Freq: Four times a day (QID) | ORAL | Status: DC | PRN

## 2012-06-10 MED ORDER — HEPARIN (PORCINE) IN NACL 2-0.9 UNIT/ML-% IJ SOLN
INTRAMUSCULAR | Status: AC
  Filled 2012-06-10: qty 1000

## 2012-06-10 MED ORDER — DIPHENHYDRAMINE HCL 50 MG/ML IJ SOLN
25.0000 mg | Freq: Once | INTRAMUSCULAR | Status: AC
  Administered 2012-06-10: 25 mg via INTRAVENOUS
  Filled 2012-06-10: qty 1

## 2012-06-10 MED ORDER — VANCOMYCIN HCL 1000 MG IV SOLR
750.0000 mg | Freq: Two times a day (BID) | INTRAVENOUS | Status: DC
  Administered 2012-06-10: 750 mg via INTRAVENOUS
  Filled 2012-06-10 (×2): qty 750

## 2012-06-10 MED ORDER — SODIUM CHLORIDE 0.9 % IV SOLN
INTRAVENOUS | Status: DC
  Administered 2012-06-10 – 2012-06-11 (×2): via INTRAVENOUS

## 2012-06-10 MED ORDER — HYDRALAZINE HCL 20 MG/ML IJ SOLN
10.0000 mg | INTRAMUSCULAR | Status: DC | PRN
  Filled 2012-06-10: qty 0.5

## 2012-06-10 MED ORDER — PHENOL 1.4 % MT LIQD: 1.0000 | OROMUCOSAL | Status: DC | PRN

## 2012-06-10 MED ORDER — ALUM & MAG HYDROXIDE-SIMETH 200-200-20 MG/5ML PO SUSP: 15.0000 mL | ORAL | Status: DC | PRN

## 2012-06-10 MED ORDER — ASPIRIN 325 MG PO TABS
325.0000 mg | ORAL_TABLET | Freq: Every day | ORAL | Status: DC
  Filled 2012-06-10 (×2): qty 1

## 2012-06-10 MED ORDER — SODIUM CHLORIDE 0.9 % IV SOLN
INTRAVENOUS | Status: DC
  Administered 2012-06-10: 02:00:00 via INTRAVENOUS

## 2012-06-10 MED ORDER — ONDANSETRON HCL 4 MG/2ML IJ SOLN
4.0000 mg | Freq: Four times a day (QID) | INTRAMUSCULAR | Status: DC | PRN
  Administered 2012-06-10 (×2): 4 mg via INTRAVENOUS
  Filled 2012-06-10 (×2): qty 2

## 2012-06-10 MED ORDER — HYDROMORPHONE HCL PF 1 MG/ML IJ SOLN: 1.0000 mg | INTRAMUSCULAR | Status: DC | PRN

## 2012-06-10 MED ORDER — HEPARIN SODIUM (PORCINE) 5000 UNIT/ML IJ SOLN
5000.0000 [IU] | Freq: Three times a day (TID) | INTRAMUSCULAR | Status: DC
  Administered 2012-06-10 – 2012-06-12 (×6): 5000 [IU] via SUBCUTANEOUS
  Filled 2012-06-10 (×9): qty 1

## 2012-06-10 MED ORDER — METOPROLOL TARTRATE 1 MG/ML IV SOLN
2.0000 mg | INTRAVENOUS | Status: DC | PRN
  Filled 2012-06-10: qty 5

## 2012-06-10 MED ORDER — HYDROMORPHONE HCL PF 1 MG/ML IJ SOLN
1.0000 mg | INTRAMUSCULAR | Status: DC | PRN
  Administered 2012-06-10 – 2012-06-12 (×7): 1 mg via INTRAVENOUS
  Filled 2012-06-10 (×6): qty 1

## 2012-06-10 MED ORDER — LIDOCAINE HCL (PF) 1 % IJ SOLN
INTRAMUSCULAR | Status: AC
  Filled 2012-06-10: qty 30

## 2012-06-10 MED ORDER — GUAIFENESIN-DM 100-10 MG/5ML PO SYRP
15.0000 mL | ORAL_SOLUTION | ORAL | Status: DC | PRN
  Filled 2012-06-10: qty 15

## 2012-06-10 MED ORDER — ONDANSETRON HCL 4 MG/2ML IJ SOLN: 4.0000 mg | Freq: Three times a day (TID) | INTRAMUSCULAR | Status: DC | PRN

## 2012-06-10 MED ORDER — HYDROMORPHONE HCL PF 1 MG/ML IJ SOLN
1.0000 mg | Freq: Once | INTRAMUSCULAR | Status: AC
  Administered 2012-06-10: 1 mg via INTRAVENOUS
  Filled 2012-06-10: qty 1

## 2012-06-10 MED ORDER — CIPROFLOXACIN IN D5W 400 MG/200ML IV SOLN
400.0000 mg | Freq: Two times a day (BID) | INTRAVENOUS | Status: DC
  Administered 2012-06-10 – 2012-06-11 (×4): 400 mg via INTRAVENOUS
  Filled 2012-06-10 (×6): qty 200

## 2012-06-10 MED ORDER — MORPHINE SULFATE 2 MG/ML IJ SOLN
2.0000 mg | INTRAMUSCULAR | Status: DC | PRN
  Administered 2012-06-11 – 2012-06-12 (×2): 4 mg via INTRAVENOUS
  Administered 2012-06-12: 2 mg via INTRAVENOUS
  Filled 2012-06-10 (×2): qty 2
  Filled 2012-06-10: qty 1

## 2012-06-10 MED ORDER — FENTANYL CITRATE 0.05 MG/ML IJ SOLN
INTRAMUSCULAR | Status: AC
  Filled 2012-06-10: qty 2

## 2012-06-10 MED ORDER — VANCOMYCIN HCL IN DEXTROSE 1-5 GM/200ML-% IV SOLN
1000.0000 mg | INTRAVENOUS | Status: DC
  Administered 2012-06-11: 1000 mg via INTRAVENOUS
  Filled 2012-06-10 (×2): qty 200

## 2012-06-10 NOTE — Progress Notes (Signed)
Subjective: Patient seen and examined, admitted with peripheral arterial disease and RLE ischemia. Patient to undergo lexiscan myoview in am before right fem pop bypass. No complaints today. Filed Vitals:   06/10/12 1408  BP: 122/55  Pulse: 67  Temp: 98 F (36.7 C)  Resp: 17    Chest: Clear Bilaterally Heart : S1S2 RRR Abdomen: Soft, nontender Ext : No edema Neuro: Alert, oriented x 3  A/P Peripheral arterial disease Ischemia right lower extremity  Right fem pop bypass as per vascular surgery.     Meredeth Ide [facility]/Palliative Medicine Team [phone]

## 2012-06-10 NOTE — Consult Note (Signed)
[facility]  Patient name: Anna Barrera MRN: [id number] DOB: 1946-02-16 Sex: female  REASON FOR CONSULT: Pre-op cardiac evaluation  HPI: Anna Barrera is a 66 y.o. female with h/o ongoing tobacco use who we are asked to see pre-operatively prior to a R fem-pop bypass.   She denies any h/o known CAD, HTN or HL. Has never had a stress test or a cath.  She developed discoloration of her right first and third toes approximately 3 weeks ago. She was admitted yesterday with ischemic toes of the right foot. Prior to developing the discoloration of her toes, she does give a long history of right calf claudication. Her symptoms are brought on by ambulation and relieved with rest. She's had no significant claudication on the left side. She has been seen by [facility] and pending R fem pop.  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 Gets around [place]. Able to go to store. Mainly limited by claudication. denies any exertional CP or SOB  Past Medical History  Diagnosis Date  . Seasonal allergies   . Perforated bowel   . No pertinent past medical history   She denies diabetes, hypertension, hypercholesterolemia.  Family History  Problem Relation Age of Onset  . Breast cancer Mother   there is no family history of premature cardiovascular disease.  SOCIAL HISTORY: History  Substance Use Topics  . Smoking status: Current Everyday Smoker  . Smokeless tobacco: Not on file  . Alcohol Use: No  she smokes a pack per day of cigarettes and has been smoking since she was a teenager.  Allergies  Allergen Reactions  . Vicodin (Hydrocodone-Acetaminophen) Other (See Comments)    Dizziness, nausea and vomiting, hot    Current Facility-Administered Medications  Medication Dose Route Frequency Provider Last Rate Last Dose  . 0.9 %  sodium chloride infusion   Intravenous Continuous Eduard Clos, MD 75 mL/hr at 06/10/12 0138    . aspirin tablet 325 mg  325  mg Oral Daily Chuck Hint, MD      . ciprofloxacin (CIPRO) IVPB 400 mg  400 mg Intravenous BID Eduard Clos, MD   400 mg at 06/10/12 0504  . diphenhydrAMINE (BENADRYL) injection 25 mg  25 mg Intravenous Once Laveda Norman, MD   25 mg at 06/10/12 0758  . fentaNYL (SUBLIMAZE) 0.05 MG/ML injection           . heparin 2-0.9 UNIT/ML-% infusion           . heparin injection 5,000 Units  5,000 Units Subcutaneous Q8H Eduard Clos, MD      . HYDROmorphone (DILAUDID) injection 1 mg  1 mg Intravenous Q4H PRN Eduard Clos, MD   1 mg at 06/10/12 0146  . HYDROmorphone (DILAUDID) injection 1 mg  1 mg Intravenous Once Leanne Chang, NP   1 mg at 06/10/12 0400  . lidocaine (XYLOCAINE) 1 % injection           . midazolam (VERSED) 2 MG/2ML injection           . ondansetron (ZOFRAN) tablet 4 mg  4 mg Oral Q6H PRN Eduard Clos, MD       Or  . ondansetron [facility]) injection 4 mg  4 mg Intravenous Q6H PRN Eduard Clos, MD   4 mg at 06/10/12 0556  . piperacillin-tazobactam (ZOSYN) IVPB 3.375 g  3.375 g Intravenous Once Flint Melter, MD   3.375  g at 06/10/12 0031  . vancomycin (VANCOCIN) 750 mg in sodium chloride 0.9 % 150 mL IVPB  750 mg Intravenous Q12H Eduard Clos, MD      . vancomycin (VANCOCIN) IVPB 1000 mg/200 mL premix  1,000 mg Intravenous Once Flint Melter, MD   1,000 mg at 06/10/12 0142  . DISCONTD: 0.9 %  sodium chloride infusion   Intravenous Continuous Flint Melter, MD 125 mL/hr at 06/10/12 0026    . DISCONTD: 0.9 %  sodium chloride infusion   Intravenous STAT Flint Melter, MD      . DISCONTD: HYDROmorphone (DILAUDID) injection 1 mg  1 mg Intravenous Q4H PRN Flint Melter, MD      . DISCONTD: ondansetron (ZOFRAN) injection 4 mg  4 mg Intravenous Q8H PRN Flint Melter, MD        REVIEW OF SYSTEMS: Arly.Keller ] denotes positive finding; [  ] denotes negative finding CARDIOVASCULAR:  [ ]  chest pain   [ ]  chest pressure   [ ]  palpitations   [ ]   orthopnea   [ ]  dyspnea on exertion   Arly.Keller ] claudication   [ ]  rest pain   [ ]  DVT   [ ]  phlebitis PULMONARY:   Arly.Keller ] productive cough   [ ]  asthma   [ ]  wheezing NEUROLOGIC:   [ ]  weakness  [ ]  paresthesias  [ ]  aphasia  [ ]  amaurosis  [ ]  dizziness HEMATOLOGIC:   [ ]  bleeding problems   [ ]  clotting disorders MUSCULOSKELETAL:  [ ]  joint pain   [ ]  joint swelling [ ]  leg swelling GASTROINTESTINAL: [ ]   blood in stool  [ ]   Hematemesis .  GENITOURINARY:  [ ]   dysuria  [ ]   hematuria PSYCHIATRIC:  [ ]  history of major depression INTEGUMENTARY:  [ ]  rashes  [ ]  ulcers CONSTITUTIONAL:  [ ]  fever   [ ]  chills  PHYSICAL EXAM: Filed Vitals:   06/10/12 0104 06/10/12 0134 06/10/12 0532 06/10/12 0833  BP: 132/74  101/58   Pulse: 75  67 83  Temp: 98.2 F (36.8 C)  98.2 F (36.8 C)   TempSrc: Oral     Resp: 18  17   Height:  5\' 2"  (1.575 m)    Weight:  54.432 kg (120 lb)    SpO2: 100%  95%    Body mass index is 21.95 kg/(m^2). GENERAL: Looks older than stated age. No distress CARDIOVASCULAR: There is a regular rate and rhythm without significant murmur appreciated. She has a right carotid bruit. She has palpable femoral pulses. She has a palpable left popliteal pulse. I cannot palpate a right popliteal pulse. I cannot palpate pedal pulses. She has no significant lower extremity swelling. PULMONARY: Clear with mildly decreased BS throughout no wheezee ABDOMEN: Soft and non-tender with normal pitched bowel sounds. Large well healed midline scar from previous hernia surgery. Persistent hernia in upper abdomen MUSCULOSKELETAL: There are no major deformities. She has bluish discoloration of her first and third toes of the right foot. NEUROLOGIC: No focal weakness or paresthesias are detected. CN intact except for "lazy" right eye PSYCHIATRIC: The patient has a normal affect.     Lab Results  Component Value Date   WBC 7.5 06/10/2012   HGB 12.1 06/10/2012   HCT 34.6* 06/10/2012   MCV 91.5  06/10/2012   PLT 213 06/10/2012   Lab Results  Component Value Date   NA 143 06/10/2012   K 3.7  06/10/2012   CL 102 06/10/2012   CO2 32 06/10/2012   Lab Results  Component Value Date   CREATININE 0.86 06/10/2012   ECG: NSR 69 No ST-T wave abnormalities.  CXR: Normal  Assessment: 1. PAD with RLE ischemia and R carotid bruit 2. Probable COPD with ongoing tobacco use   Plan/discussion:  There are no overt signs or sx of cardiac ischemia but given PAD is at high risk for underlying CAD. Functional capacity limited by claudication. I feel that she is low to moderate risk for peri-op CV complications but given RFs and limited fx capacity would recommend Lexiscan Myoview pre-op. Will order for today. Continue ASA. Start statin.  Anna Micek,MD 11:31 AM    Anna Barrera 11:18 AM

## 2012-06-10 NOTE — H&P (Addendum)
Anna Barrera is an 66 y.o. female.   Patient was seen and examined on June 10, 2012 at 12:45 AM. PCP - none. Chief Complaint:  worsening swelling and pain in the right fourth toe. HPI:  66 year old female history of tobacco abuse started experiencing pain and swelling and erythema in the right foot toes 3 weeks ago. Patient was started on antibiotics orally. For last 2 weeks patient has been continuing antibiotics but her pain and erythema persisted and had come to the ER at least 3 times. This is the fourth time patient is coming with pain and erythema but last 2 days patient's toes particularly the middle one of the right foot has become more blackish in color. The pain also has worsened. Patient will be admitted for cellulitis with ischemia of the foot. Patient denies any trauma. Denies any fever chills.  Past Medical History  Diagnosis Date  . Seasonal allergies   . Perforated bowel   . No pertinent past medical history     Past Surgical History  Procedure Date  . Hernia repair   . Colostomy   . Colostomy reversal      Family History  Problem Relation Age of Onset  . Breast cancer Mother    Social History:  reports that she has been smoking.  She does not have any smokeless tobacco history on file. She reports that she does not drink alcohol or use illicit drugs.  Allergies:  Allergies  Allergen Reactions  . Vicodin (Hydrocodone-Acetaminophen) Other (See Comments)    Dizziness, nausea and vomiting, hot    Medications Prior to Admission  Medication Sig Dispense Refill  . acetaminophen (TYLENOL) 500 MG tablet Take 500 mg by mouth every 6 (six) hours as needed. For pain      . aspirin 325 MG EC tablet Take 325 mg by mouth every 4 (four) hours as needed. For pain      . cephALEXin (KEFLEX) 500 MG capsule Take 500 mg by mouth 4 (four) times daily.      . furosemide (LASIX) 20 MG tablet Take 20 mg by mouth 2 (two) times daily.        Results for orders placed during the  hospital encounter of 06/09/12 (from the past 48 hour(s))  CBC WITH DIFFERENTIAL     Status: Normal   Collection Time   06/09/12  1:12 PM      Component Value Range Comment   WBC 8.8  4.0 - 10.5 K/uL    RBC 4.24  3.87 - 5.11 MIL/uL    Hemoglobin 13.2  12.0 - 15.0 g/dL    HCT 21.3  08.6 - 57.8 %    MCV 91.5  78.0 - 100.0 fL    MCH 31.1  26.0 - 34.0 pg    MCHC 34.0  30.0 - 36.0 g/dL    RDW 46.9  62.9 - 52.8 %    Platelets 224  150 - 400 K/uL    Neutrophils Relative 67  43 - 77 %    Neutro Abs 5.9  1.7 - 7.7 K/uL    Lymphocytes Relative 25  12 - 46 %    Lymphs Abs 2.2  0.7 - 4.0 K/uL    Monocytes Relative 7  3 - 12 %    Monocytes Absolute 0.6  0.1 - 1.0 K/uL    Eosinophils Relative 1  0 - 5 %    Eosinophils Absolute 0.1  0.0 - 0.7 K/uL    Basophils Relative 1  0 - 1 %    Basophils Absolute 0.0  0.0 - 0.1 K/uL   POCT I-STAT, CHEM 8     Status: Abnormal   Collection Time   06/09/12  1:49 PM      Component Value Range Comment   Sodium 140  135 - 145 mEq/L    Potassium 3.9  3.5 - 5.1 mEq/L    Chloride 101  96 - 112 mEq/L    BUN 4 (*) 6 - 23 mg/dL    Creatinine, Ser 1.61  0.50 - 1.10 mg/dL    Glucose, Bld 93  70 - 99 mg/dL    Calcium, Ion 0.96  0.45 - 1.30 mmol/L    TCO2 26  0 - 100 mmol/L    Hemoglobin 13.6  12.0 - 15.0 g/dL    HCT 40.9  81.1 - 91.4 %    Dg Chest 2 View  06/09/2012  *RADIOLOGY REPORT*  Clinical Data: Toe infection.  CHEST - 2 VIEW  Comparison: 12/16/2003  Findings: Improved aeration.  Vascular clips in the right upper abdomen. Lungs clear.  Heart size and pulmonary vascularity normal. No effusion.  Visualized bones unremarkable.  IMPRESSION: No acute disease  Original Report Authenticated By: Osa Craver, M.D.   Dg Foot Complete Right  06/09/2012  *RADIOLOGY REPORT*  Clinical Data: Right foot pain especially along the third toe.  RIGHT FOOT COMPLETE - 3+ VIEW  Comparison: 06/03/2012  Findings: No fracture, foreign body, or acute bony findings are  identified.  No gas is observed in the soft tissues of the distal foot.  Spurring of the first metatarsal head noted. There is mild soft tissue swelling along the dorsum of the foot distally.  Alignment at the Lisfranc joint appears normal.  IMPRESSION:  1.  Mild dorsal soft tissue swelling along the foot, without acute bony findings or foreign body.  Original Report Authenticated By: Dellia Cloud, M.D.    Review of Systems  Constitutional: Negative.   HENT: Negative.   Eyes: Negative.   Respiratory: Negative.   Cardiovascular: Negative.   Gastrointestinal: Negative.   Genitourinary: Negative.   Musculoskeletal:       Right foot pain and discoloration. With small ulcers forming.  Skin: Negative.   Neurological: Negative.   Endo/Heme/Allergies: Negative.   Psychiatric/Behavioral: Negative.     Blood pressure 132/74, pulse 75, temperature 98.2 F (36.8 C), temperature source Oral, resp. rate 18, SpO2 100.00%. Physical Exam  Constitutional: She is oriented to person, place, and time. She appears well-developed and well-nourished. No distress.  HENT:  Head: Normocephalic and atraumatic.  Right Ear: External ear normal.  Left Ear: External ear normal.  Nose: Nose normal.  Mouth/Throat: Oropharynx is clear and moist. No oropharyngeal exudate.  Eyes: Conjunctivae are normal. Pupils are equal, round, and reactive to light. Right eye exhibits no discharge. Left eye exhibits no discharge. No scleral icterus.       Right eye is mildly deviated.  Neck: Normal range of motion. Neck supple.  Cardiovascular: Normal rate and regular rhythm.   Respiratory: Effort normal and breath sounds normal. No respiratory distress. She has no wheezes. She has no rales.  GI: Soft. Bowel sounds are normal. She exhibits no distension. There is no tenderness. There is no rebound.  Musculoskeletal:       Right foot distal part is erythematous. Middle toe distal part is discolored and blackish with small  ulcer. Pulses are only doppler able.  Neurological: She is alert and oriented  to person, place, and time.       Moves all extremities.  Skin: She is not diaphoretic.  Psychiatric: Her behavior is normal.     Assessment/Plan #1. Cellulitis of the right foot and toes with ischemia - patient's pulses are dopplerable. Patient has been started on empiric antibiotics. I have discussed with vascular surgeon Dr. Edilia Bo who will be seeing patient in consult. Check EKG for rhythm. #2. Tobacco abuse - patient strongly advised to quit smoking.  I have ordered a 2-D echo as patient has a systolic murmur on exam.  CODE STATUS - full code.  Artur Winningham N. 06/10/2012, 1:11 AM

## 2012-06-10 NOTE — Progress Notes (Signed)
ANTIBIOTIC CONSULT NOTE - FOLLOW UP  Pharmacy Consult for Vancomycin + Ciprofloxacin Indication: R foot cellulitis   Allergies  Allergen Reactions  . Vicodin (Hydrocodone-Acetaminophen) Other (See Comments)    Dizziness, nausea and vomiting, hot    Patient Measurements: Height: 5\' 2"  (157.5 cm) Weight: 120 lb (54.432 kg) IBW/kg (Calculated) : 50.1   Vital Signs: Temp: 98.4 F (36.9 C) (08/14 1300) Temp src: Oral (08/14 1300) BP: 119/55 mmHg (08/14 1300) Pulse Rate: 61  (08/14 1300) Intake/Output from previous day:   Intake/Output from this shift:    Labs:  Basename 06/10/12 0540 06/09/12 1349 06/09/12 1312  WBC 7.5 -- 8.8  HGB 12.1 13.6 13.2  PLT 213 -- 224  LABCREA -- -- --  CREATININE 0.86 0.90 --   Estimated Creatinine Clearance: 51.6 ml/min (by C-G formula based on Cr of 0.86). No results found for this basename: VANCOTROUGH:2,VANCOPEAK:2,VANCORANDOM:2,GENTTROUGH:2,GENTPEAK:2,GENTRANDOM:2,TOBRATROUGH:2,TOBRAPEAK:2,TOBRARND:2,AMIKACINPEAK:2,AMIKACINTROU:2,AMIKACIN:2, in the last 72 hours   Microbiology: No results found for this or any previous visit (from the past 720 hour(s)).  Anti-infectives     Start     Dose/Rate Route Frequency Ordered Stop   06/10/12 1000   vancomycin (VANCOCIN) 750 mg in sodium chloride 0.9 % 150 mL IVPB        750 mg 150 mL/hr over 60 Minutes Intravenous Every 12 hours 06/10/12 0153     06/10/12 0600   ciprofloxacin (CIPRO) IVPB 400 mg        400 mg 200 mL/hr over 60 Minutes Intravenous 2 times daily 06/10/12 0153     06/09/12 2300  piperacillin-tazobactam (ZOSYN) IVPB 3.375 g       3.375 g 12.5 mL/hr over 240 Minutes Intravenous  Once 06/09/12 2252 06/10/12 0431   06/09/12 2300   vancomycin (VANCOCIN) IVPB 1000 mg/200 mL premix        1,000 mg 200 mL/hr over 60 Minutes Intravenous  Once 06/09/12 2253 06/10/12 0242          Assessment: 66 y.o. F admitted today with worsening swelling and pain in R foot/toes. The patient  was noted to have an occluded R and left superficial femoral artery -- to get fem-pop sometime this admission.   The patient is also on Vancomycin + Ciprofloxacin for empiric cellulitis coverage. The Vanc dose requires adjusting today given weight/renal function. The Cipro dose remains appropriate.   Goal of Therapy:  Vancomycin trough level 10-15 mcg/ml  Plan:  1. Adjust Vancomycin to 1g IV every 24 hours 2. Continue Cipro 400 mg IV every 12 hours 3. Will continue to follow renal function, culture results, LOT, and antibiotic de-escalation plans   Georgina Pillion, PharmD, BCPS Clinical Pharmacist Pager: [phone] 06/10/2012 1:40 PM

## 2012-06-10 NOTE — Op Note (Signed)
[facility] [place]  Patient name: Anna Barrera MRN: [id number] DOB: 09/09/1946 Sex: female  06/09/2012 - 06/10/2012 Pre-operative Diagnosis: Discolored right toes with early ulceration Post-operative diagnosis:  Same Surgeon:  Jorge Ny Procedure Performed:  1.  ultrasound access left femoral artery  2.  abdominal aortogram  3.  bilateral lower extremity runoff  4.  second order catheterization    Indications:  The patient presented to the hospital with a three-week history of discoloration to two toes on the right foot. She does have a history of progressive claudication which predates the last 3 weeks. She comes in today for arteriogram  Procedure:  The patient was identified in the holding area and taken to room 8.  The patient was then placed supine on the table and prepped and draped in the usual sterile fashion.  A time out was called.  Ultrasound was used to evaluate the left common femoral artery.  It was patent .  A digital ultrasound image was acquired.  A micropuncture needle was used to access the left common femoral artery under ultrasound guidance.  An 018 wire was advanced without resistance and a micropuncture sheath was placed.  The 018 wire was removed and a benson wire was placed.  The micropuncture sheath was exchanged for a 5 french sheath.  An omniflush catheter was advanced over the wire to the level of L-1.  An abdominal angiogram was obtained.  Next, using the omniflush catheter and a benson wire, the aortic bifurcation was crossed and the catheter was placed into theright external iliac artery and right runoff was obtained.  left runoff was performed via retrograde sheath injections.  Findings:   Aortogram:   the visualized portions of the suprarenal abdominal aorta showed no significant disease. Renal arteries are widely patent. There is a 20% left renal artery stenosis. The infrarenal abdominal aorta is widely patent. Bilateral  common external and internal iliac arteries are widely patent.   Right Lower Extremity:  The right common femoral artery is patent throughout it's course. The right profunda femoral artery is patent throughout it's course. The right superficial femoral artery has a flush occlusion. There is reconstitution of the above-knee popliteal artery however it is disease. The below knee popliteal artery is patent throughout it's course with 2 vessel runoff via the anterior tibial and peroneal artery.   Left Lower Extremity:  The left common femoral artery is widely patent the left profunda femoral artery is widely patent. There is a flush occlusion of the left superficial femoral artery with reconstitution in the mid thigh. The popliteal artery remains patent without significant disease. The dominant runoff vessel is the anterior tibial artery. The posterior tibial artery is occluded. Full dilation of the runoff was difficult due to  proximal disease.  Intervention:   none   Impression:  #1  occluded right superficial femoral artery with reconstitution of the above-knee popliteal artery which is diseased. There is two-vessel runoff via the anterior tibial and peroneal artery.  #2  occluded left superficial femoral artery at its origin with reconstitution in the mid thigh. Runoff was difficult to evaluate due to proximal disease however the anterior tibial is the dominant vessel.  #3  the patient is not a candidate for percutaneous intervention and will need surgical bypass    V. Durene Cal, M.D. [facility] [place] Office: [phone] Pager:  [phone]

## 2012-06-10 NOTE — Progress Notes (Signed)
Received patient post aortogram of right leg . Patient is alert and oriented X3, Not in any distress,dressing to left groin intact, no bleeding noted. Pedal pulses to bilateral leg done , present by doppler.

## 2012-06-10 NOTE — H&P (Signed)
Patient name: Anna Barrera MRN: [id number] DOB: 13-Jun-1946 Sex: female  REASON FOR CONSULT: ischemic toes right foot  HPI:  Anna Barrera is a 66 y.o. female who developed discoloration of her right first and third toes approximately 3 weeks ago. Her pain in her toes at progressed and she was admitted yesterday with ischemic toes of the right foot. Prior to developing the discoloration of her toes, she does give a long history of right calf claudication. Her symptoms are brought on by ambulation and relieved with rest. She's had no significant claudication on the left side. She has had no significant diarrhea claudication. She does describe some early rest pain in the right foot. She's had no nonhealing ulcers that she is aware of.  Past Medical History   Diagnosis  Date   .  Seasonal allergies    .  Perforated bowel    .  No pertinent past medical history    She denies diabetes, hypertension, hypercholesterolemia.  Family History   Problem  Relation  Age of Onset   .  Breast cancer  Mother    there is no family history of premature cardiovascular disease.  SOCIAL HISTORY:  History   Substance Use Topics   .  Smoking status:  Current Everyday Smoker   .  Smokeless tobacco:  Not on file   .  Alcohol Use:  No   she smokes a pack per day of cigarettes and has been smoking since she was a teenager.  Allergies   Allergen  Reactions   .  Vicodin (Hydrocodone-Acetaminophen)  Other (See Comments)     Dizziness, nausea and vomiting, hot    Current Facility-Administered Medications   Medication  Dose  Route  Frequency  Provider  Last Rate  Last Dose   .  0.9 % sodium chloride infusion   Intravenous  Continuous  Eduard Clos, MD  75 mL/hr at 06/10/12 0138    .  ciprofloxacin (CIPRO) IVPB 400 mg  400 mg  Intravenous  BID  Eduard Clos, MD   400 mg at 06/10/12 0504   .  diphenhydrAMINE (BENADRYL) injection 25 mg  25 mg  Intravenous  Once  Laveda Norman, MD     .  heparin  injection 5,000 Units  5,000 Units  Subcutaneous  Q8H  Eduard Clos, MD     .  HYDROmorphone (DILAUDID) injection 1 mg  1 mg  Intravenous  Q4H PRN  Eduard Clos, MD   1 mg at 06/10/12 0146   .  HYDROmorphone (DILAUDID) injection 1 mg  1 mg  Intravenous  Once  Leanne Chang, NP   1 mg at 06/10/12 0400   .  ondansetron (ZOFRAN) tablet 4 mg  4 mg  Oral  Q6H PRN  Eduard Clos, MD      Or   .  ondansetron [facility]) injection 4 mg  4 mg  Intravenous  Q6H PRN  Eduard Clos, MD   4 mg at 06/10/12 0556   .  piperacillin-tazobactam (ZOSYN) IVPB 3.375 g  3.375 g  Intravenous  Once  Flint Melter, MD   3.375 g at 06/10/12 0031   .  vancomycin (VANCOCIN) 750 mg in sodium chloride 0.9 % 150 mL IVPB  750 mg  Intravenous  Q12H  Eduard Clos, MD     .  vancomycin (VANCOCIN) IVPB 1000 mg/200 mL premix  1,000 mg  Intravenous  Once  Elliott L  Effie Shy, MD   1,000 mg at 06/10/12 0142   .  DISCONTD: 0.9 % sodium chloride infusion   Intravenous  Continuous  Flint Melter, MD  125 mL/hr at 06/10/12 0026    .  DISCONTD: 0.9 % sodium chloride infusion   Intravenous  STAT  Flint Melter, MD     .  DISCONTD: HYDROmorphone (DILAUDID) injection 1 mg  1 mg  Intravenous  Q4H PRN  Flint Melter, MD     .  DISCONTD: ondansetron (ZOFRAN) injection 4 mg  4 mg  Intravenous  Q8H PRN  Flint Melter, MD      REVIEW OF SYSTEMS: Arly.Keller ] denotes positive finding; [ ]  denotes negative finding  CARDIOVASCULAR: [ ]  chest pain [ ]  chest pressure [ ]  palpitations [ ]  orthopnea  [ ]  dyspnea on exertion [ ]  claudication [ ]  rest pain [ ]  DVT [ ]  phlebitis  PULMONARY: Arly.Keller ] productive cough [ ]  asthma [ ]  wheezing  NEUROLOGIC: [ ]  weakness [ ]  paresthesias [ ]  aphasia [ ]  amaurosis [ ]  dizziness  HEMATOLOGIC: [ ]  bleeding problems [ ]  clotting disorders  MUSCULOSKELETAL: [ ]  joint pain [ ]  joint swelling [ ]  leg swelling  GASTROINTESTINAL: [ ]  blood in stool [ ]  Hematemesis . She does have some nausea  this morning and vomiting likely related to her pain medication.  GENITOURINARY: [ ]  dysuria [ ]  hematuria  PSYCHIATRIC: [ ]  history of major depression  INTEGUMENTARY: [ ]  rashes [ ]  ulcers  CONSTITUTIONAL: [ ]  fever [ ]  chills  PHYSICAL EXAM:  Filed Vitals:    06/09/12 2103  06/10/12 0104  06/10/12 0134  06/10/12 0532   BP:  140/66  132/74   101/58   Pulse:  70  75   67   Temp:  98.3 F (36.8 C)  98.2 F (36.8 C)   98.2 F (36.8 C)   TempSrc:  Oral  Oral     Resp:  18  18   17    Height:    5\' 2"  (1.575 m)    Weight:    120 lb (54.432 kg)    SpO2:  99%  100%   95%    Body mass index is 21.95 kg/(m^2).  GENERAL: The patient is a well-nourished female, in no acute distress. The vital signs are documented above.  CARDIOVASCULAR: There is a regular rate and rhythm without significant murmur appreciated. She has a right carotid bruit. She has palpable femoral pulses. She has a palpable left popliteal pulse. I cannot palpate a right popliteal pulse. I cannot palpate pedal pulses. She has no significant lower extremity swelling.  PULMONARY: There is good air exchange bilaterally without wheezing or rales.  ABDOMEN: Soft and non-tender with normal pitched bowel sounds.  MUSCULOSKELETAL: There are no major deformities. She has bluish discoloration of her first and third toes of the right foot.  NEUROLOGIC: No focal weakness or paresthesias are detected.  SKIN: There are no ulcers or rashes noted.  PSYCHIATRIC: The patient has a normal affect.  DATA:  I did interrogate with the Doppler at the bedside. On the right side she has a anterior tibial and peroneal signal with the Doppler. She has a monophasic damp and posterior tibial signal. I am unable to obtain a dorsalis pedis signal on the right. On the left side she has a monophasic anterior tibial and peroneal signal. I cannot obtain a posterior tibial signal on the left.  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 She has a heavy smoker and we have discussed the importance of tobacco cessation. I have recommended we proceed with arteriography to further evaluate her infrainguinal arterial occlusive disease. Less likely she may have iliac artery occlusive disease responsible for her emboli to the right foot. Regardless, if she has disease amenable to angioplasty this could potentially be addressed at the same time. I discussed the case with Dr. Durene Cal who is agreeable to proceed today with arteriography and possible angioplasty and stenting of the right lower extremity. I have reviewed with the patient the indications for arteriography. In addition, I have reviewed the potential complications of arteriography including but not limited to: Bleeding, arterial injury, arterial thrombosis, dye action, renal insufficiency, or other unpredictable medical problems. I have explained to the patient that if we find disease amenable to angioplasty we could potentially address this at the same time. I have discussed the potential complications of angioplasty and stenting, including but not limited to: Bleeding, arterial thrombosis, arterial injury, dissection, or the need for surgical intervention.  RIGHT CAROTID BRUIT: This patient has a right carotid bruit. I will order a carotid duplex scan. I do not see that she is on aspirin so I will start aspirin.

## 2012-06-10 NOTE — Progress Notes (Signed)
MD at bedside. 

## 2012-06-10 NOTE — Progress Notes (Signed)
Clients left peripheral IV in hand infiltrated once antibiotics started. D/C'd left hand IV and called IV team. Right IV started in hand. IV in right hand infiltrated, Called IV team at 0200, new IV started in clients Left anterior fore arm

## 2012-06-10 NOTE — Progress Notes (Signed)
ANTIBIOTIC CONSULT NOTE - INITIAL  Pharmacy Consult for Vancomycin/Cipro Indication: cellulitis  Allergies  Allergen Reactions  . Vicodin (Hydrocodone-Acetaminophen) Other (See Comments)    Dizziness, nausea and vomiting, hot    Patient Measurements: Height: 5\' 2"  (157.5 cm) Weight: 120 lb (54.432 kg) IBW/kg (Calculated) : 50.1   Vital Signs: Temp: 98.2 F (36.8 C) (08/14 0104) Temp src: Oral (08/14 0104) BP: 132/74 mmHg (08/14 0104) Pulse Rate: 75  (08/14 0104)  Labs:  Basename 06/09/12 1349 06/09/12 1312  WBC -- 8.8  HGB 13.6 13.2  PLT -- 224  LABCREA -- --  CREATININE 0.90 --   Estimated Creatinine Clearance: 49.3 ml/min (by C-G formula based on Cr of 0.9). No results found for this basename: VANCOTROUGH:2,VANCOPEAK:2,VANCORANDOM:2,GENTTROUGH:2,GENTPEAK:2,GENTRANDOM:2,TOBRATROUGH:2,TOBRAPEAK:2,TOBRARND:2,AMIKACINPEAK:2,AMIKACINTROU:2,AMIKACIN:2, in the last 72 hours   Microbiology: No results found for this or any previous visit (from the past 720 hour(s)).  Medical History: Past Medical History  Diagnosis Date  . Seasonal allergies   . Perforated bowel   . No pertinent past medical history     Medications:  Prescriptions prior to admission  Medication Sig Dispense Refill  . acetaminophen (TYLENOL) 500 MG tablet Take 500 mg by mouth every 6 (six) hours as needed. For pain      . aspirin 325 MG EC tablet Take 325 mg by mouth every 4 (four) hours as needed. For pain      . cephALEXin (KEFLEX) 500 MG capsule Take 500 mg by mouth 4 (four) times daily.      . furosemide (LASIX) 20 MG tablet Take 20 mg by mouth 2 (two) times daily.       Assessment: 66 yo female with cellulitis for empiric antibiotics.  Vancomycin 1 g IV given in ED at 0145  Goal of Therapy:  Vancomycin trough level 10-15 mcg/ml  Plan:  Vancomycin 750 mg IV q12h Cipro 400 mg IV q12h  Eddie Candle 06/10/2012,1:51 AM

## 2012-06-10 NOTE — Progress Notes (Signed)
Rec'd report from ED nurse, Patient on floor and moved to 6N Bed 10

## 2012-06-10 NOTE — Progress Notes (Signed)
VASCULAR LAB PRELIMINARY  PRELIMINARY  PRELIMINARY  PRELIMINARY Carotid duplexcompleted.    Preliminary report:  Bilateral:  No evidence of hemodynamically significant internal carotid artery stenosis.   Vertebral artery flow is antegrade.     Kilie Rund,  RVS 06/10/2012, 3:57 PM

## 2012-06-10 NOTE — Consult Note (Signed)
[facility] [place]  Patient name: Anna Barrera MRN: [id number] DOB: 03-31-1946 Sex: female  REASON FOR CONSULT: ischemic toes right foot  HPI: Anna Barrera is a 66 y.o. female who developed discoloration of her right first and third toes approximately 3 weeks ago. Her pain in her toes at progressed and she was admitted yesterday with ischemic toes of the right foot. Prior to developing the discoloration of her toes, she does give a long history of right calf claudication. Her symptoms are brought on by ambulation and relieved with rest. She's had no significant claudication on the left side. She has had no significant diarrhea claudication. She does describe some early rest pain in the right foot. She's had no nonhealing ulcers that she is aware of.  Past Medical History  Diagnosis Date  . Seasonal allergies   . Perforated bowel   . No pertinent past medical history   She denies diabetes, hypertension, hypercholesterolemia.  Family History  Problem Relation Age of Onset  . Breast cancer Mother   there is no family history of premature cardiovascular disease.  SOCIAL HISTORY: History  Substance Use Topics  . Smoking status: Current Everyday Smoker  . Smokeless tobacco: Not on file  . Alcohol Use: No  she smokes a pack per day of cigarettes and has been smoking since she was a teenager.  Allergies  Allergen Reactions  . Vicodin (Hydrocodone-Acetaminophen) Other (See Comments)    Dizziness, nausea and vomiting, hot    Current Facility-Administered Medications  Medication Dose Route Frequency Provider Last Rate Last Dose  . 0.9 %  sodium chloride infusion   Intravenous Continuous Eduard Clos, MD 75 mL/hr at 06/10/12 0138    . ciprofloxacin (CIPRO) IVPB 400 mg  400 mg Intravenous BID Eduard Clos, MD   400 mg at 06/10/12 0504  . diphenhydrAMINE (BENADRYL) injection 25 mg  25 mg Intravenous Once Laveda Norman, MD      . heparin  injection 5,000 Units  5,000 Units Subcutaneous Q8H Eduard Clos, MD      . HYDROmorphone (DILAUDID) injection 1 mg  1 mg Intravenous Q4H PRN Eduard Clos, MD   1 mg at 06/10/12 0146  . HYDROmorphone (DILAUDID) injection 1 mg  1 mg Intravenous Once Leanne Chang, NP   1 mg at 06/10/12 0400  . ondansetron (ZOFRAN) tablet 4 mg  4 mg Oral Q6H PRN Eduard Clos, MD       Or  . ondansetron [facility]) injection 4 mg  4 mg Intravenous Q6H PRN Eduard Clos, MD   4 mg at 06/10/12 0556  . piperacillin-tazobactam (ZOSYN) IVPB 3.375 g  3.375 g Intravenous Once Flint Melter, MD   3.375 g at 06/10/12 0031  . vancomycin (VANCOCIN) 750 mg in sodium chloride 0.9 % 150 mL IVPB  750 mg Intravenous Q12H Eduard Clos, MD      . vancomycin (VANCOCIN) IVPB 1000 mg/200 mL premix  1,000 mg Intravenous Once Flint Melter, MD   1,000 mg at 06/10/12 0142  . DISCONTD: 0.9 %  sodium chloride infusion   Intravenous Continuous Flint Melter, MD 125 mL/hr at 06/10/12 0026    . DISCONTD: 0.9 %  sodium chloride infusion   Intravenous STAT Flint Melter, MD      . DISCONTD: HYDROmorphone (DILAUDID) injection 1 mg  1 mg Intravenous Q4H PRN Flint Melter, MD      . DISCONTD: ondansetron [facility])  injection 4 mg  4 mg Intravenous Q8H PRN Flint Melter, MD        REVIEW OF SYSTEMS: Arly.Keller ] denotes positive finding; [  ] denotes negative finding CARDIOVASCULAR:  [ ]  chest pain   [ ]  chest pressure   [ ]  palpitations   [ ]  orthopnea   [ ]  dyspnea on exertion   [ ]  claudication   [ ]  rest pain   [ ]  DVT   [ ]  phlebitis PULMONARY:   Arly.Keller ] productive cough   [ ]  asthma   [ ]  wheezing NEUROLOGIC:   [ ]  weakness  [ ]  paresthesias  [ ]  aphasia  [ ]  amaurosis  [ ]  dizziness HEMATOLOGIC:   [ ]  bleeding problems   [ ]  clotting disorders MUSCULOSKELETAL:  [ ]  joint pain   [ ]  joint swelling [ ]  leg swelling GASTROINTESTINAL: [ ]   blood in stool  [ ]   Hematemesis . She does have some nausea this  morning and vomiting likely related to her pain medication.  GENITOURINARY:  [ ]   dysuria  [ ]   hematuria PSYCHIATRIC:  [ ]  history of major depression INTEGUMENTARY:  [ ]  rashes  [ ]  ulcers CONSTITUTIONAL:  [ ]  fever   [ ]  chills  PHYSICAL EXAM: Filed Vitals:   06/09/12 2103 06/10/12 0104 06/10/12 0134 06/10/12 0532  BP: 140/66 132/74  101/58  Pulse: 70 75  67  Temp: 98.3 F (36.8 C) 98.2 F (36.8 C)  98.2 F (36.8 C)  TempSrc: Oral Oral    Resp: 18 18  17   Height:   5\' 2"  (1.575 m)   Weight:   120 lb (54.432 kg)   SpO2: 99% 100%  95%   Body mass index is 21.95 kg/(m^2). GENERAL: The patient is a well-nourished female, in no acute distress. The vital signs are documented above. CARDIOVASCULAR: There is a regular rate and rhythm without significant murmur appreciated. She has a right carotid bruit. She has palpable femoral pulses. She has a palpable left popliteal pulse. I cannot palpate a right popliteal pulse. I cannot palpate pedal pulses. She has no significant lower extremity swelling. PULMONARY: There is good air exchange bilaterally without wheezing or rales. ABDOMEN: Soft and non-tender with normal pitched bowel sounds.  MUSCULOSKELETAL: There are no major deformities. She has bluish discoloration of her first and third toes of the right foot. NEUROLOGIC: No focal weakness or paresthesias are detected. SKIN: There are no ulcers or rashes noted. PSYCHIATRIC: The patient has a normal affect.  DATA:  I did interrogate with the Doppler at the bedside. On the right side she has a anterior tibial and peroneal signal with the Doppler. She has a monophasic damp and posterior tibial signal. I am unable to obtain a dorsalis pedis signal on the right. On the left side she has a monophasic anterior tibial and peroneal signal. I cannot obtain a posterior tibial signal on the left.   Lab Results  Component Value Date   WBC PENDING 06/10/2012   HGB 12.1 06/10/2012   HCT 34.6* 06/10/2012     MCV 91.5 06/10/2012   PLT 213 06/10/2012   Lab Results  Component Value Date   NA 143 06/10/2012   K 3.7 06/10/2012   CL 102 06/10/2012   CO2 32 06/10/2012   Lab Results  Component Value Date   CREATININE 0.86 06/10/2012   MEDICAL ISSUES:  ISCHEMIC TOES OF THE RIGHT FOOT: This patient likely has atheroembolic disease  to the right foot related to infrainguinal arterial occlusive disease. She has a heavy smoker and we have discussed the importance of tobacco cessation. I have recommended we proceed with arteriography to further evaluate her infrainguinal arterial occlusive disease. Less likely she may have iliac artery occlusive disease responsible for her emboli to the right foot. Regardless, if she has disease amenable to angioplasty this could potentially be addressed at the same time. I discussed the case with Dr. Durene Cal who is agreeable to proceed today with arteriography and possible angioplasty and stenting of the right lower extremity. I have reviewed with the patient the indications for arteriography. In addition, I have reviewed the potential complications of arteriography including but not limited to: Bleeding, arterial injury, arterial thrombosis, dye action, renal insufficiency, or other unpredictable medical problems. I have explained to the patient that if we find disease amenable to angioplasty we could potentially address this at the same time. I have discussed the potential complications of angioplasty and stenting, including but not limited to: Bleeding, arterial thrombosis, arterial injury, dissection, or the need for surgical intervention.  RIGHT CAROTID BRUIT: This patient has a right carotid bruit. I will order a carotid duplex scan. I do not see that she is on aspirin so I will start aspirin.   DICKSON,CHRISTOPHER S [facility] [place] Beeper: [phone]

## 2012-06-10 NOTE — Progress Notes (Signed)
VASCULAR SURGERY Follow up  I have reviewed the arteriogram from today which shows a right superficial femoral artery occlusion with reconstitution of the below-knee popliteal artery. She appears to be a good candidate for a right femoral to below knee pop bypass. Given her risk factors I agree that preoperative cardiac evaluation as indicated. I will be out of town at a meeting and will not be able to schedule her surgery until Tuesday, 06/17/2011. Her my standpoint she could be discharged home and brought back electively for surgery on Tuesday. I do not think that femoropopliteal bypass grafting needs to the performed urgently given that this is a chronic superficial femoral artery occlusion based on the large number of collaterals present. Therefore if her pain is adequately controlled, she can be discharged from my standpoint.  Anna Barrera S. Jamine Wingate, MD, FACS Beeper [phone] 06/10/2012; 

## 2012-06-10 NOTE — Progress Notes (Signed)
VASCULAR LAB PRELIMINARY  PRELIMINARY  PRELIMINARY  PRELIMINARY  Right Lower Extremity Vein Map    Right Great Saphenous Vein   Segment Diameter Comment  1. Origin 4.96mm   2. High Thigh 3.69mm   3. Mid Thigh 3.29mm   4. Low Thigh 3.44mm   5. At Knee 4.32mm   6. High Calf 2.61mm   7. Low Calf 3.54mm   8. Ankle 2.83mm                                                    VASCULAR LAB PRELIMINARY  PRELIMINARY  PRELIMINARY  PRELIMINARY  Left Lower Extremity Vein Map    Left Great Saphenous Vein   Segment Diameter Comment  1. Origin 5.101mm   2. High Thigh 3.19mm   3. Mid Thigh 3.86mm   4. Low Thigh 2.26mm   5. At Knee 2.48mm   6. High Calf 1.14mm   7. Low Calf 2.82mm   8. Ankle 2.65mm    mm    mm    mm                                        Left greater saphenous vein begins to run posterior medial mid thigh to popliteal fossa.  Ryo Klang, RVS 06/10/2012, 4:12 PM    Nahomy Limburg, 06/10/2012, 4:08 PM

## 2012-06-10 NOTE — Progress Notes (Signed)
Based on the arteriogram results today, the patient will need a right femoral to below knee popliteal artery bypass graft. Prior to proceed with bypass graft, the patient will need vein mapping which has been ordered. In addition we will contact cardiology for formal cardiology clearance. At this time her operation has been tentatively scheduled for Tuesday by Dr. Edilia Bo

## 2012-06-10 NOTE — Progress Notes (Signed)
Client had allergic reaction to Cipro, 2 episodes of vomiting with green liquid, called [facility], Oti said he was not in charge of the patient

## 2012-06-11 ENCOUNTER — Inpatient Hospital Stay (HOSPITAL_COMMUNITY): Payer: MEDICAID

## 2012-06-11 DIAGNOSIS — L089 Local infection of the skin and subcutaneous tissue, unspecified: Secondary | ICD-10-CM

## 2012-06-11 DIAGNOSIS — R509 Fever, unspecified: Secondary | ICD-10-CM

## 2012-06-11 DIAGNOSIS — R011 Cardiac murmur, unspecified: Secondary | ICD-10-CM

## 2012-06-11 DIAGNOSIS — Z01810 Encounter for preprocedural cardiovascular examination: Secondary | ICD-10-CM

## 2012-06-11 DIAGNOSIS — I369 Nonrheumatic tricuspid valve disorder, unspecified: Secondary | ICD-10-CM

## 2012-06-11 LAB — URINALYSIS, ROUTINE W REFLEX MICROSCOPIC
Bilirubin Urine: NEGATIVE
Glucose, UA: NEGATIVE mg/dL
Ketones, ur: 15 mg/dL — AB
Nitrite: NEGATIVE
Protein, ur: NEGATIVE mg/dL
Specific Gravity, Urine: 1.043 — ABNORMAL HIGH (ref 1.005–1.030)
Urobilinogen, UA: 0.2 mg/dL (ref 0.0–1.0)
pH: 6.5 (ref 5.0–8.0)

## 2012-06-11 LAB — URINE MICROSCOPIC-ADD ON

## 2012-06-11 MED ORDER — TECHNETIUM TC 99M TETROFOSMIN IV KIT
30.0000 | PACK | Freq: Once | INTRAVENOUS | Status: AC | PRN
  Administered 2012-06-11: 30 via INTRAVENOUS

## 2012-06-11 MED ORDER — REGADENOSON 0.4 MG/5ML IV SOLN
INTRAVENOUS | Status: AC
  Filled 2012-06-11: qty 5

## 2012-06-11 MED ORDER — ACETAMINOPHEN 325 MG PO TABS
650.0000 mg | ORAL_TABLET | ORAL | Status: DC | PRN
  Administered 2012-06-11: 650 mg via ORAL
  Filled 2012-06-11: qty 2

## 2012-06-11 MED ORDER — REGADENOSON 0.4 MG/5ML IV SOLN
0.4000 mg | Freq: Once | INTRAVENOUS | Status: AC
  Administered 2012-06-11: 0.4 mg via INTRAVENOUS

## 2012-06-11 MED ORDER — HYDROMORPHONE HCL PF 1 MG/ML IJ SOLN
INTRAMUSCULAR | Status: AC
  Filled 2012-06-11: qty 1

## 2012-06-11 MED ORDER — TECHNETIUM TC 99M TETROFOSMIN IV KIT
10.0000 | PACK | Freq: Once | INTRAVENOUS | Status: AC | PRN
  Administered 2012-06-11: 10 via INTRAVENOUS

## 2012-06-11 NOTE — Progress Notes (Signed)
  Echocardiogram 2D Echocardiogram has been performed.  Nivan Melendrez 06/11/2012, 11:26 AM

## 2012-06-11 NOTE — Progress Notes (Signed)
Patient ID: Anna Barrera, female   DOB: 10/29/1945, 66 y.o.   MRN: [id number]   Please refer also to notes from our team from earlier today. During the day I have seen the patient's 2-D echo. There is normal left ventricular function. There are no focal wall motion abnormalities. I have personally reviewed the nuclear stress study. The report is not yet available. The study is technically limited. On the images there is marked interference from nuclear activity from structures below the diaphragm. This gives the impression that there may be fixed defects. However she has normal wall motion by echo. Therefore she should not have fixed defects on the nuclear study. There is no obvious ischemia on the nuclear study.   In summary, the patient is stable from the cardiac viewpoint. She is cleared for her surgery.Marland Kitchen

## 2012-06-11 NOTE — Progress Notes (Signed)
Patient Name: Anna Barrera Date of Encounter: 06/11/2012  Active Problems:  Arterial insufficiency with ischemic ulcer  Cellulitis of foot  Tobacco abuse  Pre-operative cardiovascular examination    SUBJECTIVE: Never has chest pain. No SOB. C/o pain in toes right foot.  OBJECTIVE Filed Vitals:   06/10/12 2122 06/11/12 0149 06/11/12 0221 06/11/12 0600  BP: 109/55 97/53  101/46  Pulse: 73 69  58  Temp: 98.5 F (36.9 C) 101.3 F (38.5 C) 100.4 F (38 C) 98.8 F (37.1 C)  TempSrc: Oral Oral Oral Oral  Resp: 18 16  18   Height:      Weight:      SpO2: 89% 95%  93%    Intake/Output Summary (Last 24 hours) at 06/11/12 0936 Last data filed at 06/11/12 0600  Gross per 24 hour  Intake 1918.75 ml  Output    350 ml  Net 1568.75 ml   Filed Weights   06/10/12 0134  Weight: 120 lb (54.432 kg)     PHYSICAL EXAM General: Well developed, well nourished, femal in mild distress. Head: Normocephalic, atraumatic.  Neck: Supple without bruits, JVD not elevated. Lungs:  Resp regular and unlabored, few rales bases. Heart: RRR, S1, S2, no S3, S4, or murmur. Abdomen: Soft, non-tender, non-distended, BS + x 4.  Extremities: No clubbing, no edema. Discoloration 1st and 3rd toes right foot.  Neuro: Alert and oriented X 3. Moves all extremities spontaneously. Psych: Normal affect.  LABS: CBC: Basename 06/10/12 0540 06/09/12 1349 06/09/12 1312  WBC 7.5 -- 8.8  NEUTROABS 4.6 -- 5.9  HGB 12.1 13.6 --  HCT 34.6* 40.0 --  MCV 91.5 -- 91.5  PLT 213 -- 224   INR:No results found for this basename: INR in the last 72 hours Basic Metabolic Panel: Basename 06/10/12 0540 06/09/12 1349  NA 143 140  K 3.7 3.9  CL 102 101  CO2 32 --  GLUCOSE 134* 93  BUN 7 4*  CREATININE 0.86 0.90  CALCIUM 8.8 --  MG -- --  PHOS -- --   Liver Function Tests: Basename 06/10/12 0540  AST 37  ALT 19  ALKPHOS 71  BILITOT 0.4  PROT 6.5  ALBUMIN 3.3*   TELE:   SR   Radiology/Studies: Dg  Chest 2 View  06/09/2012  *RADIOLOGY REPORT*  Clinical Data: Toe infection.  CHEST - 2 VIEW  Comparison: 12/16/2003  Findings: Improved aeration.  Vascular clips in the right upper abdomen. Lungs clear.  Heart size and pulmonary vascularity normal. No effusion.  Visualized bones unremarkable.  IMPRESSION: No acute disease  Original Report Authenticated By: Osa Craver, M.D.   Dg Foot Complete Right  06/09/2012  *RADIOLOGY REPORT*  Clinical Data: Right foot pain especially along the third toe.  RIGHT FOOT COMPLETE - 3+ VIEW  Comparison: 06/03/2012  Findings: No fracture, foreign body, or acute bony findings are identified.  No gas is observed in the soft tissues of the distal foot.  Spurring of the first metatarsal head noted. There is mild soft tissue swelling along the dorsum of the foot distally.  Alignment at the Lisfranc joint appears normal.  IMPRESSION:  1.  Mild dorsal soft tissue swelling along the foot, without acute bony findings or foreign body.  Original Report Authenticated By: Dellia Cloud, M.D.   Dg Foot Complete Right  06/03/2012  *RADIOLOGY REPORT*  Clinical Data: Pain and swelling fourth toe  RIGHT FOOT COMPLETE - 3+ VIEW  Comparison: None.  Findings: Three views  of the right foot submitted.  No acute fracture or subluxation.  Spurring noted at the base of fifth metatarsal.  No periosteal reaction or bony erosion.  IMPRESSION: No acute fracture or subluxation.  Original Report Authenticated By: Natasha Mead, M.D.    Current Medications:     . aspirin EC  81 mg Oral Daily  . ciprofloxacin  400 mg Intravenous BID  . fentaNYL      . heparin      . heparin  5,000 Units Subcutaneous Q8H  . HYDROmorphone      . lidocaine      . midazolam      . simvastatin  20 mg Oral q1800  . vancomycin  1,000 mg Intravenous Q24H  . DISCONTD: aspirin  325 mg Oral Daily  . DISCONTD: vancomycin  750 mg Intravenous Q12H      . sodium chloride 75 mL/hr at 06/10/12 0138  . sodium  chloride 75 mL/hr at 06/11/12 0600    ASSESSMENT AND PLAN:  Pre-operative cardiovascular examination - pt has multiple cardiac risk factors, including PAD - MV scheduled today, f/u on results, no ischemic symptoms.  Otherwise, per primary MD. Active Problems:  Arterial insufficiency with ischemic ulcer  Cellulitis of foot  Tobacco abuse    Signed, Theodore Demark , PA-C 9:36 AM 06/11/2012 Jerral Bonito, MD

## 2012-06-11 NOTE — Progress Notes (Signed)
Lexiscan MV performed. Bjorn Loser Barrett 06/11/2012 9:48 AM

## 2012-06-11 NOTE — Progress Notes (Signed)
Subjective: Patient seen and examined, Fever of 101 last night. Blood cultures have been obtained, UA shows small leukocytes.  Objective: Vital signs in last 24 hours: Temp:  [98 F (36.7 C)-101.3 F (38.5 C)] 98.8 F (37.1 C) (08/15 0600) Pulse Rate:  [58-108] 85  (08/15 0949) Resp:  [16-18] 18  (08/15 0600) BP: (97-131)/(46-61) 121/47 mmHg (08/15 0947) SpO2:  [89 %-95 %] 93 % (08/15 0600) Weight change:  Last BM Date: 06/10/12  Intake/Output from previous day: 08/14 0701 - 08/15 0700 In: 1918.8 [P.O.:480; I.V.:1438.8] Out: 350 [Urine:150; Emesis/NG output:200]     Physical Exam: Head: Normocephalic, atraumatic.  Eyes: No signs of jaundice, EOMI Nose: Mucous membranes dry.   Neck: supple,No deformities, masses, or tenderness noted. Lungs: Normal respiratory effort. Bibasilar crackles, R > L Heart: Regular RR. S1 and S2 normal  Abdomen: BS normoactive. Soft, Nondistended, non-tender.  Extremities: Right foot cold to touch, bluish discoloration of toes   Lab Results: Basic Metabolic Panel:  Basename 06/10/12 0540 06/09/12 1349  NA 143 140  K 3.7 3.9  CL 102 101  CO2 32 --  GLUCOSE 134* 93  BUN 7 4*  CREATININE 0.86 0.90  CALCIUM 8.8 --  MG -- --  PHOS -- --   Liver Function Tests:  Basename 06/10/12 0540  AST 37  ALT 19  ALKPHOS 71  BILITOT 0.4  PROT 6.5  ALBUMIN 3.3*   No results found for this basename: LIPASE:2,AMYLASE:2 in the last 72 hours No results found for this basename: AMMONIA:2 in the last 72 hours CBC:  Basename 06/10/12 0540 06/09/12 1349 06/09/12 1312  WBC 7.5 -- 8.8  NEUTROABS 4.6 -- 5.9  HGB 12.1 13.6 --  HCT 34.6* 40.0 --  MCV 91.5 -- 91.5  PLT 213 -- 224    Basename 06/11/12 0702  COLORURINE YELLOW  LABSPEC 1.043*  PHURINE 6.5  GLUCOSEU NEGATIVE  HGBUR SMALL*  BILIRUBINUR NEGATIVE  KETONESUR 15*  PROTEINUR NEGATIVE  UROBILINOGEN 0.2  NITRITE NEGATIVE  LEUKOCYTESUR SMALL*     Studies/Results: Dg Chest 2  View  06/09/2012  *RADIOLOGY REPORT*  Clinical Data: Toe infection.  CHEST - 2 VIEW  Comparison: 12/16/2003  Findings: Improved aeration.  Vascular clips in the right upper abdomen. Lungs clear.  Heart size and pulmonary vascularity normal. No effusion.  Visualized bones unremarkable.  IMPRESSION: No acute disease  Original Report Authenticated By: Osa Craver, M.D.   Dg Foot Complete Right  06/09/2012  *RADIOLOGY REPORT*  Clinical Data: Right foot pain especially along the third toe.  RIGHT FOOT COMPLETE - 3+ VIEW  Comparison: 06/03/2012  Findings: No fracture, foreign body, or acute bony findings are identified.  No gas is observed in the soft tissues of the distal foot.  Spurring of the first metatarsal head noted. There is mild soft tissue swelling along the dorsum of the foot distally.  Alignment at the Lisfranc joint appears normal.  IMPRESSION:  1.  Mild dorsal soft tissue swelling along the foot, without acute bony findings or foreign body.  Original Report Authenticated By: Dellia Cloud, M.D.    Medications: Scheduled Meds:   . aspirin EC  81 mg Oral Daily  . ciprofloxacin  400 mg Intravenous BID  . heparin  5,000 Units Subcutaneous Q8H  . HYDROmorphone      . regadenoson      . regadenoson  0.4 mg Intravenous Once  . simvastatin  20 mg Oral q1800  . vancomycin  1,000 mg Intravenous Q24H  .  DISCONTD: vancomycin  750 mg Intravenous Q12H   Continuous Infusions:   . sodium chloride 75 mL/hr at 06/10/12 0138  . sodium chloride 75 mL/hr at 06/11/12 1201   PRN Meds:.acetaminophen, alum & mag hydroxide-simeth, guaiFENesin-dextromethorphan, hydrALAZINE, HYDROmorphone (DILAUDID) injection, labetalol, metoprolol, morphine injection, ondansetron (ZOFRAN) IV, ondansetron, phenol, technetium tetrofosmin, technetium tetrofosmin  Assessment/Plan:  Active Problems:  Arterial insufficiency with ischemic ulcer  Cellulitis of foot  Tobacco abuse  Pre-operative cardiovascular  examination  Fever: ?cause Will obtain urine culture and CXR She had been on vancomycin and ciprofloxacin.  ? Cellulitis Patient has been on oral antibiotics for last 2 weeks without improvement in erythema of the foot as it is caused by ischemia. Will continue with antibiotics for now,.  Pre op cardiac eval Patient underwent cardiac stress test. Cardiology following  PAD Patient to undergo fempop bypass on Tuesday.   DVT prophylaxis Heparin    LOS: 2 days   [facility] Pager: [phone] 06/11/2012, 1:32 PM

## 2012-06-12 DIAGNOSIS — R509 Fever, unspecified: Secondary | ICD-10-CM

## 2012-06-12 LAB — CBC
HCT: 33.8 % — ABNORMAL LOW (ref 36.0–46.0)
MCV: 92.9 fL (ref 78.0–100.0)
RBC: 3.64 MIL/uL — ABNORMAL LOW (ref 3.87–5.11)
RDW: 12.7 % (ref 11.5–15.5)
WBC: 8.6 10*3/uL (ref 4.0–10.5)

## 2012-06-12 MED ORDER — OXYCODONE HCL 5 MG PO TABS: 5.0000 mg | ORAL_TABLET | Freq: Four times a day (QID) | ORAL | Status: DC | PRN

## 2012-06-12 MED ORDER — SULFAMETHOXAZOLE-TRIMETHOPRIM 800-160 MG PO TABS: 1.0000 | ORAL_TABLET | Freq: Two times a day (BID) | ORAL | Status: AC

## 2012-06-12 MED ORDER — SIMVASTATIN 20 MG PO TABS: 20.0000 mg | ORAL_TABLET | Freq: Every day | ORAL | Status: DC

## 2012-06-12 NOTE — Progress Notes (Signed)
[facility]                                                                                   Anna Barrera, is a 66 y.o. female  DOB [birth year]  MRN [id number].  Admission date:  06/09/2012  Discharge Date:  06/12/2012  Primary MD  DEFAULT,PROVIDER, MD  Admitting Physician  Eduard Clos, MD  Admission Diagnosis  Foot infection [686.9] Arterial insufficiency with ischemic ulcer [707.10, 443.9] recheck claudication CLAUDICATION  Discharge Diagnosis     Active Problems:  Arterial insufficiency with ischemic ulcer  Cellulitis of foot  Tobacco abuse  Pre-operative cardiovascular examination  Fever     Past Medical History  Diagnosis Date  . Seasonal allergies   . Perforated bowel   . No pertinent past medical history     Past Surgical History  Procedure Date  . Hernia repair   . Colostomy   . Colostomy reversal       Recommendations for PCP to follow Follow urine culture results.     Discharge Condition: Stable   Diet recommendation: See Discharge Instructions below   Consults  Cardiology Vascular surgery   History of present illness and  Hospital Course:  See H&P, Labs, Consult and Test reports for all details in brief, patient was admitted for   66 year old female history of tobacco abuse started experiencing pain and swelling and erythema in the right foot toes 3 weeks ago. Patient was started on antibiotics orally. For last 2 weeks patient has been continuing antibiotics but her pain and erythema persisted and had come to the ER at least 3 times. This is the fourth time patient is coming with pain and erythema but last 2 days patient's toes particularly the middle one of the right foot has become more blackish in color. The pain also has worsened. Patient will be admitted for cellulitis with ischemia of the foot. Patient denies any trauma. Denies any fever chills.  Peripheral arterial disease/Right foot ischemia Patient  underwent arteriogram and vascular surgery determined that she will need a right femoral to below knee popliteal artrey bypass graft. Patient is supposed to go for surgery on Tuesday 06/16/12.  Cellulitis Patient had considerable erythema of the foot on the day of admission, and had been antibiotics for the last two weeks. Patient was started on vancomycin and ciprofloxacin in the hospital, at this time erythema has considerably reduced, will send her home on po bactrim bid for five more days.  Fever Patient had one time fever of 101 in the hospital, all the work up including bllod cultures, UA, CXR has been negative. Fever resolved. Urine culture results are pending. Patient to go home on Bactrim, most likely she had fever due to cellulitis.  Preop cardiac clearance Patient was seen by cardiology, and underwent nuclear myoview  test, at this time cardiology has cleared the patient for surgery and they recommend to continue aspirin. They also started her on zocor.    Today   Subjective:   Anna Barrera today has no complaints. Objective:   Blood pressure 100/45, pulse 88, temperature 98.7 F (37.1 C), temperature source Oral, resp. rate 18, height 5'  2" (1.575 m), weight 54.432 kg (120 lb), SpO2 95.00%.   Intake/Output Summary (Last 24 hours) at 06/12/12 1315 Last data filed at 06/12/12 1032  Gross per 24 hour  Intake   3814 ml  Output      0 ml  Net   3814 ml    Exam  Head: Normocephalic, atraumatic.  Eyes: No signs of jaundice, EOMI  Nose: Mucous membranes dry.  Neck: supple,No deformities, masses, or tenderness noted.  Lungs: Normal respiratory effort. Clear bilaterally Heart: Regular RR. S1 and S2 normal  Abdomen: BS normoactive. Soft, Nondistended, non-tender.  Extremities: Right foot cold to touch, bluish discoloration of toes    Data Review   Major procedures and Radiology Reports - PLEASE review detailed and final reports for all details in brief -    arteriogram   Dg Chest 2 View  06/11/2012  *RADIOLOGY REPORT*  Clinical Data: Fever, crackles  CHEST - 2 VIEW  Comparison: Chest x-ray of 08/13 1013  Findings: There is minimal linear atelectasis at the right lung base.  No infiltrate or effusion is seen.  Mediastinal contours appear stable.  The heart is within normal limits in size.  No bony abnormality is seen.  Surgical clips are present in the right upper quadrant from prior cholecystectomy.  IMPRESSION: Mild linear atelectasis at the right lung base.  No active lung disease.  Original Report Authenticated By: Juline Patch, M.D.   Dg Chest 2 View  06/09/2012  *RADIOLOGY REPORT*  Clinical Data: Toe infection.  CHEST - 2 VIEW  Comparison: 12/16/2003  Findings: Improved aeration.  Vascular clips in the right upper abdomen. Lungs clear.  Heart size and pulmonary vascularity normal. No effusion.  Visualized bones unremarkable.  IMPRESSION: No acute disease  Original Report Authenticated By: Osa Craver, M.D.   Nm Myocar Multi W/spect W/wall Motion / Ef  06/11/2012  Clinical Data:  Tobacco use.  Preoperative assessment.  Technique:  Standard myocardial SPECT imaging performed after resting intravenous injection of Tc-42m tetrofosmin.  Subsequently, stress in the form of Lexiscan  was administered under the supervision of the Cardiology staff.  At peak stress, Tc-43m tetrofosmin was injected intravenously and standard myocardial SPECT imaging performed.  Quantitative gated imaging also performed to evaluate left ventricular wall motion and estimate left ventricular ejection fraction.  Radiopharmaceutical: Tc-23m tetrofosmin, 10 mCi at rest and 30 mCi at stress.  Comparison:  None  MYOCARDIAL IMAGING WITH SPECT (REST AND STRESS)  Findings:  Reduced inferior wall activity on stress and rest imaging may well be due to diaphragmatic attenuation although scar can have a similar appearance.  There is considerable activity in bowel along the left  hemidiaphragm, which do not clear even with oral water administration.  There is also considerable breathing motion artifact.  LEFT VENTRICULAR EJECTION FRACTION  Findings:  Left ventricular end-diastolic volume is 41 cc.  End- systolic volume is 19 cc.  Derived LV ejection fraction is 54%.  GATED LEFT VENTRICULAR WALL MOTION STUDY  Findings:  Equivocal hypokinesis along the inferolateral cardiac base noted.  IMPRESSION:  1.  Reduced inferior wall activity on stress rest imaging, probably from diaphragmatic attenuation and less likely due to scar.  No inducible ischemia observed.  Original Report Authenticated By: Dellia Cloud, M.D.   Dg Foot Complete Right  06/09/2012  *RADIOLOGY REPORT*  Clinical Data: Right foot pain especially along the third toe.  RIGHT FOOT COMPLETE - 3+ VIEW  Comparison: 06/03/2012  Findings: No fracture, foreign body,  or acute bony findings are identified.  No gas is observed in the soft tissues of the distal foot.  Spurring of the first metatarsal head noted. There is mild soft tissue swelling along the dorsum of the foot distally.  Alignment at the Lisfranc joint appears normal.  IMPRESSION:  1.  Mild dorsal soft tissue swelling along the foot, without acute bony findings or foreign body.  Original Report Authenticated By: Dellia Cloud, M.D.   Dg Foot Complete Right  06/03/2012  *RADIOLOGY REPORT*  Clinical Data: Pain and swelling fourth toe  RIGHT FOOT COMPLETE - 3+ VIEW  Comparison: None.  Findings: Three views of the right foot submitted.  No acute fracture or subluxation.  Spurring noted at the base of fifth metatarsal.  No periosteal reaction or bony erosion.  IMPRESSION: No acute fracture or subluxation.  Original Report Authenticated By: Natasha Mead, M.D.    Micro Results    Recent Results (from the past 240 hour(s))  CULTURE, BLOOD (ROUTINE X 2)     Status: Normal (Preliminary result)   Collection Time   06/11/12  2:57 AM      Component Value Range  Status Comment   Specimen Description BLOOD RIGHT ARM   Final    Special Requests BOTTLES DRAWN AEROBIC ONLY 1CC   Final    Culture  Setup Time 06/11/2012 09:02   Final    Culture     Final    Value:        BLOOD CULTURE RECEIVED NO GROWTH TO DATE CULTURE WILL BE HELD FOR 5 DAYS BEFORE ISSUING A FINAL NEGATIVE REPORT   Report Status PENDING   Incomplete   CULTURE, BLOOD (ROUTINE X 2)     Status: Normal (Preliminary result)   Collection Time   06/11/12  3:20 AM      Component Value Range Status Comment   Specimen Description BLOOD RIGHT HAND   Final    Special Requests BOTTLES DRAWN AEROBIC ONLY 10CC   Final    Culture  Setup Time 06/11/2012 09:02   Final    Culture     Final    Value:        BLOOD CULTURE RECEIVED NO GROWTH TO DATE CULTURE WILL BE HELD FOR 5 DAYS BEFORE ISSUING A FINAL NEGATIVE REPORT   Report Status PENDING   Incomplete      CBC w Diff: Lab Results  Component Value Date   WBC 8.6 06/12/2012   HGB 11.5* 06/12/2012   HCT 33.8* 06/12/2012   PLT 182 06/12/2012   LYMPHOPCT 26 06/10/2012   MONOPCT 8 06/10/2012   EOSPCT 2 06/10/2012   BASOPCT 1 06/10/2012    CMP: Lab Results  Component Value Date   NA 143 06/10/2012   K 3.7 06/10/2012   CL 102 06/10/2012   CO2 32 06/10/2012   BUN 7 06/10/2012   CREATININE 0.86 06/10/2012   PROT 6.5 06/10/2012   ALBUMIN 3.3* 06/10/2012   BILITOT 0.4 06/10/2012   ALKPHOS 71 06/10/2012   AST 37 06/10/2012   ALT 19 06/10/2012  .   Discharge Instructions     Follow up Dr Edilia Bo on 06/16/12 for surgery at [facility]  Follow-up Information    Follow up with Chuck Hint, MD on 06/16/2012. (Come to [facility])    Contact information:   [address] [place] [address] [phone]            Discharge Medications   Medication List  As of 06/12/2012  1:15 PM   START taking these medications         oxyCODONE 5 MG immediate release tablet   Commonly known as: Oxy IR/ROXICODONE   Take 1 tablet  (5 mg total) by mouth every 6 (six) hours as needed.      simvastatin 20 MG tablet   Commonly known as: ZOCOR   Take 1 tablet (20 mg total) by mouth daily at 6 PM.      sulfamethoxazole-trimethoprim 800-160 MG per tablet   Commonly known as: BACTRIM DS,SEPTRA DS   Take 1 tablet by mouth 2 (two) times daily.         CONTINUE taking these medications         acetaminophen 500 MG tablet   Commonly known as: TYLENOL      aspirin 325 MG EC tablet      furosemide 20 MG tablet   Commonly known as: LASIX         STOP taking these medications         cephALEXin 500 MG capsule          Where to get your medications    These are the prescriptions that you need to pick up.   You may get these medications from any pharmacy.         oxyCODONE 5 MG immediate release tablet   simvastatin 20 MG tablet   sulfamethoxazole-trimethoprim 800-160 MG per tablet               Total Time in preparing paper work, data evaluation and todays exam - 35 minutes  Daje Stark S M.D on 06/12/2012 at 1:15 PM  [facility] Office  [phone]

## 2012-06-12 NOTE — Progress Notes (Addendum)
Vascular Surgery The patient has cardiac clearance and her LE vein mapping is complete.  She will come in Tuesday 06-16-2012 for right femoral to below-knee popliteal artery by pass surgery with Dr. Edilia Bo.  Ceasar Lund 06-12-2012  [facility] [place]  Subjective  -  Patient has had no change in her symptoms today No shortness of breath No chest pain   Physical Exam:  Gen:  NAD Pulm: non-labored Ext:  Early ulceration right toe, no infection       Assessment/Plan:   The patient has cardiac clearance. I have reviewed her carotid dopplers which do not reveal a high grade stenosis I discussed with the patient proceeding with right femoral popliteal bypass with Dr. Edilia Bo on Tuesday.  I believe she should have an adequate GSV by vein mapping.  All of her questions were answered today.  She understands that this is a limb threatening situation.   She may be discharged and return the day of surgery  BRABHAM IV, V. WELLS 06/12/2012 11:13 PM --  Filed Vitals:   06/12/12 1412  BP: 116/63  Pulse: 89  Temp: 97.2 F (36.2 C)  Resp: 20    Intake/Output Summary (Last 24 hours) at 06/12/12 2313 Last data filed at 06/12/12 1032  Gross per 24 hour  Intake   2814 ml  Output      0 ml  Net   2814 ml     Laboratory CBC    Component Value Date/Time   WBC 8.6 06/12/2012 1025   HGB 11.5* 06/12/2012 1025   HCT 33.8* 06/12/2012 1025   PLT 182 06/12/2012 1025    BMET    Component Value Date/Time   NA 143 06/10/2012 0540   K 3.7 06/10/2012 0540   CL 102 06/10/2012 0540   CO2 32 06/10/2012 0540   GLUCOSE 134* 06/10/2012 0540   BUN 7 06/10/2012 0540   CREATININE 0.86 06/10/2012 0540   CALCIUM 8.8 06/10/2012 0540   GFRNONAA 69* 06/10/2012 0540   GFRAA 80* 06/10/2012 0540    COAG No results found for this basename: INR, PROTIME   No results found for this basename: PTT    Antibiotics Anti-infectives     Start     Dose/Rate Route  Frequency Ordered Stop   06/12/12 0000  sulfamethoxazole-trimethoprim (BACTRIM DS) 800-160 MG per tablet       1 tablet Oral 2 times daily 06/12/12 1306 06/22/12 2359   06/11/12 1200   vancomycin (VANCOCIN) IVPB 1000 mg/200 mL premix  Status:  Discontinued        1,000 mg 200 mL/hr over 60 Minutes Intravenous Every 24 hours 06/10/12 1341 06/12/12 1724   06/10/12 1000   vancomycin (VANCOCIN) 750 mg in sodium chloride 0.9 % 150 mL IVPB  Status:  Discontinued        750 mg 150 mL/hr over 60 Minutes Intravenous Every 12 hours 06/10/12 0153 06/10/12 1341   06/10/12 0600   ciprofloxacin (CIPRO) IVPB 400 mg  Status:  Discontinued        400 mg 200 mL/hr over 60 Minutes Intravenous 2 times daily 06/10/12 0153 06/12/12 1724   06/09/12 2300   piperacillin-tazobactam (ZOSYN) IVPB 3.375 g        3.375 g 12.5 mL/hr over 240 Minutes Intravenous  Once 06/09/12 2252 06/10/12 0431   06/09/12 2300   vancomycin (VANCOCIN) IVPB 1000 mg/200 mL premix        1,000 mg 200 mL/hr over 60 Minutes  Intravenous  Once 06/09/12 2253 06/10/12 0242           V. Charlena Cross, M.D. [facility] [place] Office: [phone] Pager:  [phone]

## 2012-06-12 NOTE — Progress Notes (Signed)
PROGRESS NOTE  Subjective:   Pt is a 66 yo with PVD, smoker admitted with ischemic toes on her right foot.  Echo showed normal LV function without segmental wall motion abnormalities.  Myoview showed no ischemia.  She had artifact from uptake in of the radiotracer in the bowel which caused "fixed" defects on the myoview but these do not appear to be scars on echo.  See discussion from Dr. Henrietta Hoover note from 8/15.  she denies any pain.  Objective:    Vital Signs:   Temp:  [98.6 F (37 C)-99 F (37.2 C)] 98.7 F (37.1 C) (08/16 0550) Pulse Rate:  [69-108] 88  (08/16 0550) Resp:  [18-20] 18  (08/16 0550) BP: (100-131)/(45-61) 100/45 mmHg (08/16 0550) SpO2:  [95 %-98 %] 95 % (08/16 0550)  Last BM Date: 06/10/12   24-hour weight change: Weight change:   Weight trends: Filed Weights   06/10/12 0134  Weight: 120 lb (54.432 kg)    Intake/Output:  08/15 0701 - 08/16 0700 In: 3464 [I.V.:3064; IV Piggyback:400] Out: -      Physical Exam: BP 100/45  Pulse 88  Temp 98.7 F (37.1 C) (Oral)  Resp 18  Ht 5\' 2"  (1.575 m)  Wt 120 lb (54.432 kg)  BMI 21.95 kg/m2  SpO2 95%  General: Vital signs reviewed and noted. Well-developed, well-nourished, in no acute distress; alert, appropriate and cooperative .  Head: Normocephalic, atraumatic.  Eyes: conjunctivae/corneas clear.  EOM's intact.   Throat: normal  Neck: Supple. Normal carotids. I did not hear any bruits this am. No JVD  Lungs:  Clear to auscultation  Heart: Regular rate,  With normal  S1 S2. No murmurs, gallops or rubs  Abdomen:  Soft, non-tender, non-distended with normoactive bowel sounds. No hepatomegaly. No rebound/guarding. No abdominal masses.  Extremities: Toes on right foot are dusky   Neurologic: A&O X3, CN II - XII are grossly intact. Motor strength is 5/5 in the all 4 extremities.  Psych: Responds to questions appropriately with normal affect.    Labs: BMET:  Basename 06/10/12 0540 06/09/12 1349  NA 143  140  K 3.7 3.9  CL 102 101  CO2 32 --  GLUCOSE 134* 93  BUN 7 4*  CREATININE 0.86 0.90  CALCIUM 8.8 --  MG -- --  PHOS -- --    Liver function tests:  Basename 06/10/12 0540  AST 37  ALT 19  ALKPHOS 71  BILITOT 0.4  PROT 6.5  ALBUMIN 3.3*   No results found for this basename: LIPASE:2,AMYLASE:2 in the last 72 hours  CBC:  Basename 06/10/12 0540 06/09/12 1349 06/09/12 1312  WBC 7.5 -- 8.8  NEUTROABS 4.6 -- 5.9  HGB 12.1 13.6 --  HCT 34.6* 40.0 --  MCV 91.5 -- 91.5  PLT 213 -- 224    Cardiac Enzymes: No results found for this basename: CKTOTAL:4,CKMB:4,TROPONINI:4 in the last 72 hours  Coagulation Studies: No results found for this basename: LABPROT:5,INR:5 in the last 72 hours   Tele:  NSR  Medications:    Infusions:    . sodium chloride 10 mL/hr (06/11/12 1612)  . DISCONTD: sodium chloride 75 mL/hr at 06/11/12 1201    Scheduled Medications:    . aspirin EC  81 mg Oral Daily  . ciprofloxacin  400 mg Intravenous BID  . heparin  5,000 Units Subcutaneous Q8H  . HYDROmorphone      . regadenoson      . regadenoson  0.4 mg Intravenous Once  .  simvastatin  20 mg Oral q1800  . vancomycin  1,000 mg Intravenous Q24H    Assessment/ Plan:    Arterial insufficiency with ischemic ulcer (06/10/2012)  plan per [facility]VS.  Pre-operative cardiovascular examination (06/10/2012) She has been cleared for surgery by Dr. Myrtis Ser.  We will sign off. Call for questions.   Disposition: for Fem-pop on Tuesday according to patient.  Length of Stay: 3  Vesta Mixer, Montez Hageman., MD, [facility] 06/12/2012, 7:51 AM Office [phone] Pager [phone]

## 2012-06-13 LAB — URINE CULTURE

## 2012-06-15 ENCOUNTER — Encounter (HOSPITAL_COMMUNITY): Payer: Self-pay

## 2012-06-15 ENCOUNTER — Other Ambulatory Visit: Payer: Self-pay | Admitting: *Deleted

## 2012-06-15 ENCOUNTER — Encounter (HOSPITAL_COMMUNITY)
Admission: RE | Admit: 2012-06-15 | Discharge: 2012-06-15 | Disposition: A | Discharge status: Home / Self Care | Payer: 59 | Source: Ambulatory Visit | Attending: Vascular Surgery | Admitting: Vascular Surgery

## 2012-06-15 HISTORY — DX: Anemia, unspecified: D64.9

## 2012-06-15 HISTORY — DX: Depression, unspecified: F32.A

## 2012-06-15 HISTORY — DX: Adverse effect of unspecified anesthetic, initial encounter: T41.45XA

## 2012-06-15 HISTORY — DX: Peripheral vascular disease, unspecified: I73.9

## 2012-06-15 HISTORY — DX: Constipation, unspecified: K59.00

## 2012-06-15 HISTORY — DX: Other complications of anesthesia, initial encounter: T88.59XA

## 2012-06-15 HISTORY — DX: Major depressive disorder, single episode, unspecified: F32.9

## 2012-06-15 LAB — URINALYSIS, ROUTINE W REFLEX MICROSCOPIC
Glucose, UA: NEGATIVE mg/dL
Ketones, ur: NEGATIVE mg/dL
Nitrite: NEGATIVE
Specific Gravity, Urine: 1.006 (ref 1.005–1.030)
pH: 7 (ref 5.0–8.0)

## 2012-06-15 LAB — COMPREHENSIVE METABOLIC PANEL
ALT: 31 U/L (ref 0–35)
AST: 21 U/L (ref 0–37)
Alkaline Phosphatase: 74 U/L (ref 39–117)
CO2: 27 mEq/L (ref 19–32)
Calcium: 9.2 mg/dL (ref 8.4–10.5)
GFR calc Af Amer: 60 mL/min — ABNORMAL LOW (ref >90)
GFR calc non Af Amer: 52 mL/min — ABNORMAL LOW (ref >90)
Glucose, Bld: 94 mg/dL (ref 70–99)
Potassium: 3.9 mEq/L (ref 3.5–5.1)
Sodium: 143 mEq/L (ref 135–145)
Total Protein: 6.9 g/dL (ref 6.0–8.3)

## 2012-06-15 LAB — URINE MICROSCOPIC-ADD ON

## 2012-06-15 LAB — CBC
Hemoglobin: 12 g/dL (ref 12.0–15.0)
Platelets: 190 10*3/uL (ref 150–400)
RBC: 3.88 MIL/uL (ref 3.87–5.11)

## 2012-06-15 LAB — PROTIME-INR
INR: 1 (ref 0.00–1.49)
Prothrombin Time: 13.4 seconds (ref 11.6–15.2)

## 2012-06-15 LAB — APTT: aPTT: 24 seconds (ref 24–37)

## 2012-06-15 LAB — TYPE AND SCREEN

## 2012-06-15 MED ORDER — DEXTROSE 5 % IV SOLN
1.5000 g | INTRAVENOUS | Status: AC
  Administered 2012-06-16: 1.5 g via INTRAVENOUS
  Filled 2012-06-15: qty 1.5

## 2012-06-15 MED ORDER — SODIUM CHLORIDE 0.9 % IV SOLN: INTRAVENOUS | Status: DC

## 2012-06-15 NOTE — Pre-Procedure Instructions (Signed)
20 Anna Barrera  06/15/2012   Your procedure is scheduled YN:[id number], August 20th  Report to [facility] at 5:30AM.   Call this number if you have problems the morning of surgery: [phone]   Remember:   Do not eat food or any thing to drink:After Midnight.     Take these medicines the morning of surgery with A SIP OF WATER: Sulfamethoxazole-trimethoprim (Bactrim DS).  May take Hydrocodone- Acetaminophen (Norco/ Vicodin) if needed.    Do not wear jewelry, make-up or nail polish.  Do not wear lotions, powders, or perfumes. You may wear deodorant.  Do not shave 48 hours prior to surgery. Men may shave face and neck.  Do not bring valuables to the hospital.  Contacts, dentures or bridgework may not be worn into surgery.  Leave suitcase in the car. After surgery it may be brought to your room.  For patients admitted to the hospital, checkout time is 11:00 AM the day of discharge.   Patients discharged the day of surgery will not be allowed to drive home.  Name and phone number of your driver: NA  Special Instructions: CHG Shower Use Special Wash: 1/2 bottle night before surgery and 1/2 bottle morning of surgery.   Please read over the following fact sheets that you were given: Pain Booklet, Coughing and Deep Breathing and Surgical Site Infection Prevention

## 2012-06-16 ENCOUNTER — Encounter (HOSPITAL_COMMUNITY): Payer: Self-pay | Admitting: Anesthesiology

## 2012-06-16 ENCOUNTER — Encounter (HOSPITAL_COMMUNITY)
Admission: RE | Disposition: A | Discharge status: Home / Self Care | Payer: Self-pay | Source: Ambulatory Visit | Attending: Vascular Surgery

## 2012-06-16 ENCOUNTER — Inpatient Hospital Stay (HOSPITAL_COMMUNITY): Payer: 59

## 2012-06-16 ENCOUNTER — Encounter (HOSPITAL_COMMUNITY): Payer: Self-pay | Admitting: *Deleted

## 2012-06-16 ENCOUNTER — Encounter (HOSPITAL_COMMUNITY): Payer: Self-pay

## 2012-06-16 ENCOUNTER — Inpatient Hospital Stay (HOSPITAL_COMMUNITY): Payer: MEDICAID | Admitting: Anesthesiology

## 2012-06-16 ENCOUNTER — Inpatient Hospital Stay (HOSPITAL_COMMUNITY)
Admission: RE | Admit: 2012-06-16 | Discharge: 2012-06-19 | DRG: 254 | Disposition: A | Discharge status: Home / Self Care | Payer: MEDICAID | Source: Ambulatory Visit | Attending: Vascular Surgery | Admitting: Vascular Surgery

## 2012-06-16 DIAGNOSIS — I739 Peripheral vascular disease, unspecified: Secondary | ICD-10-CM

## 2012-06-16 DIAGNOSIS — I771 Stricture of artery: Secondary | ICD-10-CM

## 2012-06-16 DIAGNOSIS — L98499 Non-pressure chronic ulcer of skin of other sites with unspecified severity: Secondary | ICD-10-CM

## 2012-06-16 DIAGNOSIS — Z01812 Encounter for preprocedural laboratory examination: Secondary | ICD-10-CM

## 2012-06-16 DIAGNOSIS — I70219 Atherosclerosis of native arteries of extremities with intermittent claudication, unspecified extremity: Principal | ICD-10-CM | POA: Diagnosis present

## 2012-06-16 HISTORY — PX: FEMORAL-TIBIAL BYPASS GRAFT: SHX938

## 2012-06-16 HISTORY — PX: INTRAOPERATIVE ARTERIOGRAM: SHX5157

## 2012-06-16 SURGERY — CREATION, BYPASS, ARTERIAL, FEMORAL TO TIBIAL, USING GRAFT
Anesthesia: General | Site: Leg Upper | Laterality: Right | Wound class: Clean

## 2012-06-16 MED ORDER — BISACODYL 10 MG RE SUPP: 10.0000 mg | Freq: Every day | RECTAL | Status: DC | PRN

## 2012-06-16 MED ORDER — IOHEXOL 300 MG/ML  SOLN
INTRAMUSCULAR | Status: DC | PRN
  Administered 2012-06-16: 25 mL via INTRAVENOUS

## 2012-06-16 MED ORDER — MIDAZOLAM HCL 5 MG/5ML IJ SOLN
INTRAMUSCULAR | Status: DC | PRN
  Administered 2012-06-16: 2 mg via INTRAVENOUS

## 2012-06-16 MED ORDER — PHENOL 1.4 % MT LIQD: 1.0000 | OROMUCOSAL | Status: DC | PRN

## 2012-06-16 MED ORDER — HYDRALAZINE HCL 20 MG/ML IJ SOLN
10.0000 mg | INTRAMUSCULAR | Status: DC | PRN
  Filled 2012-06-16: qty 0.5

## 2012-06-16 MED ORDER — DEXAMETHASONE SODIUM PHOSPHATE 4 MG/ML IJ SOLN
INTRAMUSCULAR | Status: DC | PRN
  Administered 2012-06-16: 4 mg via INTRAVENOUS

## 2012-06-16 MED ORDER — SULFAMETHOXAZOLE-TMP DS 800-160 MG PO TABS
1.0000 | ORAL_TABLET | Freq: Two times a day (BID) | ORAL | Status: DC
  Administered 2012-06-16 – 2012-06-19 (×6): 1 via ORAL
  Filled 2012-06-16 (×7): qty 1

## 2012-06-16 MED ORDER — SODIUM CHLORIDE 0.9 % IR SOLN
Status: DC | PRN
  Administered 2012-06-16: 09:00:00

## 2012-06-16 MED ORDER — ASPIRIN EC 325 MG PO TBEC
325.0000 mg | DELAYED_RELEASE_TABLET | Freq: Every day | ORAL | Status: DC
  Administered 2012-06-17 – 2012-06-19 (×3): 325 mg via ORAL
  Filled 2012-06-16 (×3): qty 1

## 2012-06-16 MED ORDER — SODIUM CHLORIDE 0.9 % IV SOLN: 500.0000 mL | Freq: Once | INTRAVENOUS | Status: AC | PRN

## 2012-06-16 MED ORDER — DOCUSATE SODIUM 100 MG PO CAPS
100.0000 mg | ORAL_CAPSULE | Freq: Every day | ORAL | Status: DC
  Administered 2012-06-17 – 2012-06-19 (×3): 100 mg via ORAL
  Filled 2012-06-16 (×3): qty 1

## 2012-06-16 MED ORDER — LIDOCAINE HCL 1 % IJ SOLN
INTRAMUSCULAR | Status: DC | PRN
  Administered 2012-06-16: 60 mg via INTRADERMAL

## 2012-06-16 MED ORDER — ONDANSETRON HCL 4 MG/2ML IJ SOLN
4.0000 mg | Freq: Four times a day (QID) | INTRAMUSCULAR | Status: DC | PRN
  Administered 2012-06-16: 4 mg via INTRAVENOUS
  Filled 2012-06-16: qty 2

## 2012-06-16 MED ORDER — HYDROMORPHONE HCL PF 1 MG/ML IJ SOLN: 0.2500 mg | INTRAMUSCULAR | Status: DC | PRN

## 2012-06-16 MED ORDER — SODIUM BICARBONATE 8.4 % IV SOLN
INTRAVENOUS | Status: DC
  Filled 2012-06-16 (×2): qty 2.5

## 2012-06-16 MED ORDER — MORPHINE SULFATE 2 MG/ML IJ SOLN
INTRAMUSCULAR | Status: AC
  Administered 2012-06-16: 2 mg via INTRAVENOUS
  Filled 2012-06-16: qty 1

## 2012-06-16 MED ORDER — LABETALOL HCL 5 MG/ML IV SOLN
10.0000 mg | INTRAVENOUS | Status: DC | PRN
  Filled 2012-06-16: qty 4

## 2012-06-16 MED ORDER — ALUM & MAG HYDROXIDE-SIMETH 200-200-20 MG/5ML PO SUSP
15.0000 mL | ORAL | Status: DC | PRN
  Administered 2012-06-16: 30 mL via ORAL
  Filled 2012-06-16: qty 30

## 2012-06-16 MED ORDER — DOPAMINE-DEXTROSE 3.2-5 MG/ML-% IV SOLN: 3.0000 ug/kg/min | INTRAVENOUS | Status: DC

## 2012-06-16 MED ORDER — EPHEDRINE SULFATE 50 MG/ML IJ SOLN
INTRAMUSCULAR | Status: DC | PRN
  Administered 2012-06-16: 5 mg via INTRAVENOUS
  Administered 2012-06-16 (×4): 10 mg via INTRAVENOUS
  Administered 2012-06-16: 5 mg via INTRAVENOUS

## 2012-06-16 MED ORDER — POTASSIUM CHLORIDE CRYS ER 20 MEQ PO TBCR: 20.0000 meq | EXTENDED_RELEASE_TABLET | Freq: Once | ORAL | Status: AC | PRN

## 2012-06-16 MED ORDER — FUROSEMIDE 20 MG PO TABS
20.0000 mg | ORAL_TABLET | Freq: Two times a day (BID) | ORAL | Status: DC
  Administered 2012-06-16 – 2012-06-19 (×6): 20 mg via ORAL
  Filled 2012-06-16 (×8): qty 1

## 2012-06-16 MED ORDER — SENNOSIDES-DOCUSATE SODIUM 8.6-50 MG PO TABS
1.0000 | ORAL_TABLET | Freq: Every evening | ORAL | Status: DC | PRN
  Filled 2012-06-16: qty 1

## 2012-06-16 MED ORDER — SIMVASTATIN 20 MG PO TABS
20.0000 mg | ORAL_TABLET | Freq: Every day | ORAL | Status: DC
  Administered 2012-06-16 – 2012-06-18 (×3): 20 mg via ORAL
  Filled 2012-06-16 (×4): qty 1

## 2012-06-16 MED ORDER — METOPROLOL TARTRATE 1 MG/ML IV SOLN: 2.0000 mg | INTRAVENOUS | Status: DC | PRN

## 2012-06-16 MED ORDER — DEXTROSE 5 % IV SOLN
1.5000 g | Freq: Two times a day (BID) | INTRAVENOUS | Status: AC
  Administered 2012-06-16 – 2012-06-17 (×2): 1.5 g via INTRAVENOUS
  Filled 2012-06-16 (×2): qty 1.5

## 2012-06-16 MED ORDER — HEPARIN SODIUM (PORCINE) 1000 UNIT/ML IJ SOLN
INTRAMUSCULAR | Status: DC | PRN
  Administered 2012-06-16: 6000 [IU] via INTRAVENOUS

## 2012-06-16 MED ORDER — NEOSTIGMINE METHYLSULFATE 1 MG/ML IJ SOLN
INTRAMUSCULAR | Status: DC | PRN
  Administered 2012-06-16: 2 mg via INTRAVENOUS

## 2012-06-16 MED ORDER — GUAIFENESIN-DM 100-10 MG/5ML PO SYRP: 15.0000 mL | ORAL_SOLUTION | ORAL | Status: DC | PRN

## 2012-06-16 MED ORDER — NICOTINE 14 MG/24HR TD PT24
14.0000 mg | MEDICATED_PATCH | Freq: Every day | TRANSDERMAL | Status: DC
  Administered 2012-06-16 – 2012-06-19 (×4): 14 mg via TRANSDERMAL
  Filled 2012-06-16 (×4): qty 1

## 2012-06-16 MED ORDER — PROPOFOL 10 MG/ML IV EMUL
INTRAVENOUS | Status: DC | PRN
  Administered 2012-06-16: 80 mg via INTRAVENOUS

## 2012-06-16 MED ORDER — SODIUM CHLORIDE 0.9 % IV SOLN
INTRAVENOUS | Status: DC
  Administered 2012-06-16: 15:00:00 via INTRAVENOUS

## 2012-06-16 MED ORDER — PROTAMINE SULFATE 10 MG/ML IV SOLN
INTRAVENOUS | Status: DC | PRN
  Administered 2012-06-16: 10 mg via INTRAVENOUS
  Administered 2012-06-16: 5 mg via INTRAVENOUS
  Administered 2012-06-16 (×2): 2.5 mg via INTRAVENOUS

## 2012-06-16 MED ORDER — ACETAMINOPHEN 325 MG PO TABS
325.0000 mg | ORAL_TABLET | ORAL | Status: DC | PRN
  Administered 2012-06-19: 650 mg via ORAL
  Filled 2012-06-16: qty 2

## 2012-06-16 MED ORDER — MORPHINE SULFATE 2 MG/ML IJ SOLN
2.0000 mg | INTRAMUSCULAR | Status: DC | PRN
  Administered 2012-06-16: 2 mg via INTRAVENOUS
  Administered 2012-06-17: 4 mg via INTRAVENOUS
  Filled 2012-06-16: qty 1
  Filled 2012-06-16: qty 2

## 2012-06-16 MED ORDER — PANTOPRAZOLE SODIUM 40 MG PO TBEC
40.0000 mg | DELAYED_RELEASE_TABLET | Freq: Every day | ORAL | Status: DC
  Administered 2012-06-16 – 2012-06-18 (×3): 40 mg via ORAL
  Filled 2012-06-16 (×3): qty 1

## 2012-06-16 MED ORDER — 0.9 % SODIUM CHLORIDE (POUR BTL) OPTIME
TOPICAL | Status: DC | PRN
  Administered 2012-06-16: 1000 mL

## 2012-06-16 MED ORDER — LACTATED RINGERS IV SOLN
INTRAVENOUS | Status: DC | PRN
  Administered 2012-06-16 (×3): via INTRAVENOUS

## 2012-06-16 MED ORDER — LIDOCAINE HCL 4 % MT SOLN
OROMUCOSAL | Status: DC | PRN
  Administered 2012-06-16: 4 mL via TOPICAL

## 2012-06-16 MED ORDER — FENTANYL CITRATE 0.05 MG/ML IJ SOLN
INTRAMUSCULAR | Status: DC | PRN
  Administered 2012-06-16 (×2): 25 ug via INTRAVENOUS
  Administered 2012-06-16: 50 ug via INTRAVENOUS
  Administered 2012-06-16: 25 ug via INTRAVENOUS
  Administered 2012-06-16 (×2): 50 ug via INTRAVENOUS
  Administered 2012-06-16: 100 ug via INTRAVENOUS
  Administered 2012-06-16: 50 ug via INTRAVENOUS
  Administered 2012-06-16: 25 ug via INTRAVENOUS

## 2012-06-16 MED ORDER — GLYCOPYRROLATE 0.2 MG/ML IJ SOLN
INTRAMUSCULAR | Status: DC | PRN
  Administered 2012-06-16: 0.2 mg via INTRAVENOUS

## 2012-06-16 MED ORDER — SULFAMETHOXAZOLE-TRIMETHOPRIM 800-160 MG PO TABS: 1.0000 | ORAL_TABLET | Freq: Two times a day (BID) | ORAL | Status: DC

## 2012-06-16 MED ORDER — PHENYLEPHRINE HCL 10 MG/ML IJ SOLN
10.0000 mg | INTRAVENOUS | Status: DC | PRN
  Administered 2012-06-16: 10 ug/min via INTRAVENOUS

## 2012-06-16 MED ORDER — IOHEXOL 300 MG/ML  SOLN
INTRAMUSCULAR | Status: AC
  Filled 2012-06-16: qty 1

## 2012-06-16 MED ORDER — MAGNESIUM SULFATE 40 MG/ML IJ SOLN
2.0000 g | Freq: Once | INTRAMUSCULAR | Status: AC | PRN
  Filled 2012-06-16: qty 50

## 2012-06-16 MED ORDER — ROCURONIUM BROMIDE 100 MG/10ML IV SOLN
INTRAVENOUS | Status: DC | PRN
  Administered 2012-06-16: 50 mg via INTRAVENOUS

## 2012-06-16 MED ORDER — ASPIRIN EC 325 MG PO TBEC: 325.0000 mg | DELAYED_RELEASE_TABLET | Freq: Every day | ORAL | Status: DC

## 2012-06-16 MED ORDER — SODIUM BICARBONATE 8.4 % IV SOLN
INTRAVENOUS | Status: DC | PRN
  Administered 2012-06-16: 09:00:00

## 2012-06-16 MED ORDER — OXYCODONE-ACETAMINOPHEN 5-325 MG PO TABS
1.0000 | ORAL_TABLET | ORAL | Status: DC | PRN
  Administered 2012-06-17 – 2012-06-18 (×5): 2 via ORAL
  Filled 2012-06-16 (×5): qty 2

## 2012-06-16 MED ORDER — ACETAMINOPHEN 650 MG RE SUPP: 325.0000 mg | RECTAL | Status: DC | PRN

## 2012-06-16 MED ORDER — ONDANSETRON HCL 4 MG/2ML IJ SOLN
INTRAMUSCULAR | Status: DC | PRN
  Administered 2012-06-16: 4 mg via INTRAVENOUS

## 2012-06-16 SURGICAL SUPPLY — 64 items
ADH SKN CLS APL DERMABOND .7 (GAUZE/BANDAGES/DRESSINGS) ×6
BANDAGE ELASTIC 4 VELCRO ST LF (GAUZE/BANDAGES/DRESSINGS) IMPLANT
BANDAGE ESMARK 6X9 LF (GAUZE/BANDAGES/DRESSINGS) IMPLANT
BNDG CMPR 9X6 STRL LF SNTH (GAUZE/BANDAGES/DRESSINGS) ×2
BNDG ESMARK 6X9 LF (GAUZE/BANDAGES/DRESSINGS) ×3
CANISTER SUCTION 2500CC (MISCELLANEOUS) ×3 IMPLANT
CANNULA VESSEL W/WING W/VALVE (CANNULA) IMPLANT
CANNULA VESSEL W/WING WO/VALVE (CANNULA) ×2 IMPLANT
CLIP TI MEDIUM 24 (CLIP) ×3 IMPLANT
CLIP TI WIDE RED SMALL 24 (CLIP) ×4 IMPLANT
CLOTH BEACON ORANGE TIMEOUT ST (SAFETY) ×3 IMPLANT
COVER SURGICAL LIGHT HANDLE (MISCELLANEOUS) ×3 IMPLANT
CUFF TOURNIQUET SINGLE 18IN (TOURNIQUET CUFF) IMPLANT
CUFF TOURNIQUET SINGLE 24IN (TOURNIQUET CUFF) ×1 IMPLANT
CUFF TOURNIQUET SINGLE 34IN LL (TOURNIQUET CUFF) IMPLANT
CUFF TOURNIQUET SINGLE 44IN (TOURNIQUET CUFF) IMPLANT
DERMABOND ADVANCED (GAUZE/BANDAGES/DRESSINGS) ×3
DERMABOND ADVANCED .7 DNX12 (GAUZE/BANDAGES/DRESSINGS) ×2 IMPLANT
DRAIN CHANNEL 15F RND FF W/TCR (WOUND CARE) IMPLANT
DRAPE WARM FLUID 44X44 (DRAPE) ×3 IMPLANT
DRAPE X-RAY CASS 24X20 (DRAPES) IMPLANT
ELECT REM PT RETURN 9FT ADLT (ELECTROSURGICAL) ×3
ELECTRODE REM PT RTRN 9FT ADLT (ELECTROSURGICAL) ×2 IMPLANT
EVACUATOR SILICONE 100CC (DRAIN) IMPLANT
GAUZE SPONGE 4X4 16PLY XRAY LF (GAUZE/BANDAGES/DRESSINGS) ×1 IMPLANT
GLOVE BIO SURGEON STRL SZ 6.5 (GLOVE) ×1 IMPLANT
GLOVE BIO SURGEON STRL SZ7.5 (GLOVE) ×3 IMPLANT
GLOVE BIOGEL PI IND STRL 6.5 (GLOVE) IMPLANT
GLOVE BIOGEL PI IND STRL 7.0 (GLOVE) IMPLANT
GLOVE BIOGEL PI IND STRL 7.5 (GLOVE) ×2 IMPLANT
GLOVE BIOGEL PI INDICATOR 6.5 (GLOVE) ×3
GLOVE BIOGEL PI INDICATOR 7.0 (GLOVE) ×2
GLOVE BIOGEL PI INDICATOR 7.5 (GLOVE) ×1
GLOVE ECLIPSE 6.5 STRL STRAW (GLOVE) ×1 IMPLANT
GOWN STRL NON-REIN LRG LVL3 (GOWN DISPOSABLE) ×9 IMPLANT
KIT BASIN OR (CUSTOM PROCEDURE TRAY) ×3 IMPLANT
KIT ROOM TURNOVER OR (KITS) ×3 IMPLANT
MARKER GRAFT CORONARY BYPASS (MISCELLANEOUS) ×1 IMPLANT
NS IRRIG 1000ML POUR BTL (IV SOLUTION) ×6 IMPLANT
PACK PERIPHERAL VASCULAR (CUSTOM PROCEDURE TRAY) ×3 IMPLANT
PAD ARMBOARD 7.5X6 YLW CONV (MISCELLANEOUS) ×6 IMPLANT
PADDING CAST COTTON 6X4 STRL (CAST SUPPLIES) ×1 IMPLANT
SET COLLECT BLD 21X3/4 12 (NEEDLE) ×1 IMPLANT
SPONGE SURGIFOAM ABS GEL 100 (HEMOSTASIS) IMPLANT
STOPCOCK 4 WAY LG BORE MALE ST (IV SETS) ×1 IMPLANT
SUT PROLENE 5 0 C 1 24 (SUTURE) ×3 IMPLANT
SUT PROLENE 6 0 BV (SUTURE) ×4 IMPLANT
SUT PROLENE 7 0 BV 1 (SUTURE) IMPLANT
SUT SILK 2 0 FS (SUTURE) IMPLANT
SUT SILK 2 0 SH (SUTURE) ×3 IMPLANT
SUT SILK 3 0 (SUTURE) ×9
SUT SILK 3-0 18XBRD TIE 12 (SUTURE) IMPLANT
SUT VIC AB 2-0 CTB1 (SUTURE) ×6 IMPLANT
SUT VIC AB 3-0 SH 27 (SUTURE) ×6
SUT VIC AB 3-0 SH 27X BRD (SUTURE) ×4 IMPLANT
SUT VICRYL 4-0 PS2 18IN ABS (SUTURE) ×8 IMPLANT
SYR 30ML LL (SYRINGE) ×1 IMPLANT
SYR 30ML SLIP (SYRINGE) IMPLANT
TOWEL OR 17X24 6PK STRL BLUE (TOWEL DISPOSABLE) ×6 IMPLANT
TOWEL OR 17X26 10 PK STRL BLUE (TOWEL DISPOSABLE) ×6 IMPLANT
TRAY FOLEY CATH 14FRSI W/METER (CATHETERS) ×3 IMPLANT
TUBING EXTENTION W/L.L. (IV SETS) ×1 IMPLANT
UNDERPAD 30X30 INCONTINENT (UNDERPADS AND DIAPERS) ×3 IMPLANT
WATER STERILE IRR 1000ML POUR (IV SOLUTION) ×3 IMPLANT

## 2012-06-16 NOTE — Interval H&P Note (Signed)
History and Physical Interval Note:  06/16/2012 7:24 AM  Anna Barrera  has presented today for surgery, with the diagnosis of CLAUDICATION  The various methods of treatment have been discussed with the patient and family. After consideration of risks, benefits and other options for treatment, the patient has consented to  Procedure(s) (LRB): BYPASS GRAFT FEMORAL-TIBIAL ARTERY (Right) as a surgical intervention .  The patient's history has been reviewed, patient examined, no change in status, stable for surgery.  I have reviewed the patient's chart and labs.  Questions were answered to the patient's satisfaction.     Katrese Shell S

## 2012-06-16 NOTE — Anesthesia Postprocedure Evaluation (Signed)
  Anesthesia Post-op Note  Patient: Anna Barrera  Procedure(s) Performed: Procedure(s) (LRB): BYPASS GRAFT FEMORAL-TIBIAL ARTERY (Right) INTRA OPERATIVE ARTERIOGRAM (Right)  Patient Location: PACU  Anesthesia Type: General  Level of Consciousness: awake and alert   Airway and Oxygen Therapy: Patient Spontanous Breathing and Patient connected to nasal cannula oxygen  Post-op Pain: none  Post-op Assessment: Post-op Vital signs reviewed, Patient's Cardiovascular Status Stable, Respiratory Function Stable, Patent Airway and No signs of Nausea or vomiting  Post-op Vital Signs: Reviewed and stable  Complications: No apparent anesthesia complications

## 2012-06-16 NOTE — Progress Notes (Signed)
VASCULAR POSTOP NOTE  SUBJECTIVE: no complaints.  PHYSICAL EXAM: Filed Vitals:   06/16/12 1252 06/16/12 1300 06/16/12 1332 06/16/12 1601  BP: 129/68  117/68   Pulse: 94  96   Temp:  98 F (36.7 C) 98 F (36.7 C) 97.8 F (36.6 C)  TempSrc:   Oral Oral  Resp: 23     Height:   5\' 2"  (1.575 m)   Weight:  125 lb (56.7 kg) 133 lb 2.5 oz (60.4 kg)   SpO2: 98%  100%    Palpable right popliteal pulse. Right foot hyperemic.  ASSESSMENT/PLAN: 1. Doing well postop. 2. Tobacco cessation consult. Nicotine patch for now.  Waverly Ferrari, MD, FACS Beeper: [phone] 06/16/2012

## 2012-06-16 NOTE — Anesthesia Preprocedure Evaluation (Addendum)
Anesthesia Evaluation  Patient identified by MRN, date of birth, ID band Patient awake    Reviewed: Allergy & Precautions, H&P , NPO status , Patient's Chart, lab work & pertinent test results  History of Anesthesia Complications Negative for: history of anesthetic complications  Airway Mallampati: I TM Distance: >3 FB Neck ROM: Full    Dental No notable dental hx. (+) Edentulous Upper, Edentulous Lower and Dental Advisory Given   Pulmonary Current Smoker,  Seasonal allergies   Pulmonary exam normal       Cardiovascular + Peripheral Vascular Disease negative cardio ROS   Echo 05/2012 Study Conclusions  - Left ventricle: The cavity size was normal. Wall thickness   was increased in a pattern of mild LVH. The estimated   ejection fraction was 60%. Wall motion was normal; there   were no regional wall motion abnormalities. - Tricuspid valve: Mild regurgitation. - Pulmonary arteries: PA peak pressure: 37mm Hg (S). - Impressions: There are no significant valvular   abnormalities. Impressions: - There are no significant valvular abnormalities.    Neuro/Psych PSYCHIATRIC DISORDERS Depression negative neurological ROS     GI/Hepatic negative GI ROS, Neg liver ROS, Vental hernia H/o perforated bowel s/p resection   Endo/Other  negative endocrine ROS  Renal/GU negative Renal ROS  negative genitourinary   Musculoskeletal  (+) Arthritis -, Osteoarthritis,    Abdominal   Peds  Hematology negative hematology ROS (+)   Anesthesia Other Findings   Reproductive/Obstetrics negative OB ROS                         Anesthesia Physical Anesthesia Plan  ASA: III  Anesthesia Plan: General   Post-op Pain Management:    Induction: Intravenous  Airway Management Planned: Oral ETT  Additional Equipment:   Intra-op Plan:   Post-operative Plan: Extubation in OR  Informed Consent: I have reviewed  the patients History and Physical, chart, labs and discussed the procedure including the risks, benefits and alternatives for the proposed anesthesia with the patient or authorized representative who has indicated his/her understanding and acceptance.   Dental advisory given  Plan Discussed with: CRNA  Anesthesia Plan Comments:         Anesthesia Quick Evaluation

## 2012-06-16 NOTE — Transfer of Care (Signed)
Immediate Anesthesia Transfer of Care Note  Patient: Anna Barrera  Procedure(s) Performed: Procedure(s) (LRB): BYPASS GRAFT FEMORAL-TIBIAL ARTERY (Right) INTRA OPERATIVE ARTERIOGRAM (Right)  Patient Location: PACU  Anesthesia Type: General  Level of Consciousness: awake, alert , oriented and patient cooperative  Airway & Oxygen Therapy: Patient Spontanous Breathing and Patient connected to nasal cannula oxygen  Post-op Assessment: Report given to PACU RN, Post -op Vital signs reviewed and stable and Patient moving all extremities X 4  Post vital signs: Reviewed and stable  Complications: No apparent anesthesia complications

## 2012-06-16 NOTE — Progress Notes (Signed)
Utilization review completed.  

## 2012-06-16 NOTE — Progress Notes (Signed)
Report given to jamie rn as caregiver 

## 2012-06-16 NOTE — Progress Notes (Signed)
06/16/2012 1:31 PM Pt arrived from pacu. Vss. Pt complaining of 10/10 pain in right foot, gave 2mg  iv morphine. Pulses are dopplerable. Bed is low and call bell with in reach. Safety discussed. Will continue to monitor. Celesta Gentile

## 2012-06-16 NOTE — Preoperative (Signed)
Beta Blockers   Reason not to administer Beta Blockers:Not Applicable 

## 2012-06-16 NOTE — Progress Notes (Signed)
roght peroneal pulse heard with doppler

## 2012-06-16 NOTE — Op Note (Signed)
NAME: Anna Barrera   MRN: [id number] DOB: October 26, 1946    DATE OF OPERATION: 06/16/2012  PREOP DIAGNOSIS: ischemic toes right foot with infrainguinal arterial occlusive disease  POSTOP DIAGNOSIS: same  PROCEDURE: right femoral to below knee popliteal artery bypass graft with a non-reversed translocated saphenous vein graft and intraoperative arteriogram  SURGEON: Di Kindle. Edilia Bo, MD, FACS  ASSIST: Lianne Cure PA  ANESTHESIA: Gen.   EBL: minimal  INDICATIONS: Anna Barrera is a 66 y.o. female with a long history of claudication of the right lower extremity. She developed ischemic first and third toes of the right foot. She underwent an arteriogram which showed a long segment occlusion of the superficial femoral artery and tibial artery occlusive disease. Given her progressive ischemia of the right lower extremity she is brought in for revascularization.  FINDINGS: patent below knee popliteal artery. Intraoperative arteriogram shows 2 vessel runoff via the anterior tibial and peroneal arteries. The anterior tibial artery is dominant. The posterior tibial artery is occluded.  TECHNIQUE: The patient was brought to the operating room and received a general anesthetic. The right lower extremity was prepped and draped in the usual sterile fashion. A longitudinal incision was made in the right groin. The saphenofemoral junction was dissected free. The vein was marginal in size in the medial branch was a smaller branch and ended in the mid thigh. The lateral branch was the dominant saphenous vein. The common femoral, superficial femoral, and deep femoral arteries were dissected free and controlled with vessel loops. Through 3 additional incisions along the medial aspect of the right leg the greater saphenous vein was harvested to the mid calf. Branches were divided between clips and 3-0 silk ties. Through the distal incision the below-knee popliteal artery was exposed. It was patent.  The vein was then removed in its entirety. The saphenofemoral junction was clamped and the saphenous vein removed from the femoral vein. The femoral vein was oversewn with a 5-0 Prolene suture. The proximal valve in the saphenous vein was sharply excised. A tunnel was created and the below-knee incision to the groin incision. The patient was heparinized. The common femoral deep femoral and superficial femoral arteries were controlled. A longitudinal arteriotomy was made on the anterior lateral aspect of the common femoral artery over a soft area. The vein was sewn end to side to the artery using continuous 6-0 Prolene suture. Prior to completing the anastomosis the arteries were backbled and flushed appropriate we. There was excellent inflow. The anastomosis was completed. The graft was then flushed with heparinized saline and a retrograde [place] valvulotome used to lyse the valves. Excellent flow was established to the graft was then flushed with heparinized saline marked to prevent twisting. It was then brought to the previously created tunnel. A tourniquet was placed on the upper thigh. The leg was exsanguinated with an Esmarch bandage and the tourniquet inflated to 300 mmHg. Under tourniquet control a longitudinal arteriotomy was made in the below-knee popliteal artery. The vein was cut to the appropriate length spatulated and sewn end to side to the artery using continuous 6-0 Prolene suture. Prior to completing this anastomosis the tourniquet was released. The artery was backbled and flushed appropriately and the anastomosis completed. Flow was reestablished to the right leg. Completion arteriogram showed no technical problems. There was two-vessel runoff via the anterior tibial and peroneal arteries. The heparin was partially reversed with protamine. Each of the wounds closed the deep layer 3-0 Vicryl the skin closed with 4-0 Vicryl. The groin  incision was closed with 2 deep layers of 2-0 Vicryl and the skin  closed with 4-0 Vicryl. Dermabond was applied. The patient tolerated the procedure well and was transferred to the recovery room in stable condition. All needle and sponge counts were correct.  Waverly Ferrari, MD, FACS [facility]  DATE OF DICTATION:   06/16/2012

## 2012-06-16 NOTE — H&P (View-Only) (Signed)
VASCULAR SURGERY Follow up  I have reviewed the arteriogram from today which shows a right superficial femoral artery occlusion with reconstitution of the below-knee popliteal artery. She appears to be a good candidate for a right femoral to below knee pop bypass. Given her risk factors I agree that preoperative cardiac evaluation as indicated. I will be out of town at a meeting and will not be able to schedule her surgery until Tuesday, 06/17/2011. Her my standpoint she could be discharged home and brought back electively for surgery on Tuesday. I do not think that femoropopliteal bypass grafting needs to the performed urgently given that this is a chronic superficial femoral artery occlusion based on the large number of collaterals present. Therefore if her pain is adequately controlled, she can be discharged from my standpoint.  Di Kindle. Edilia Bo, MD, FACS Beeper [phone] 06/10/2012;

## 2012-06-17 ENCOUNTER — Encounter (HOSPITAL_COMMUNITY): Payer: Self-pay | Admitting: Vascular Surgery

## 2012-06-17 LAB — CULTURE, BLOOD (ROUTINE X 2)
Culture: NO GROWTH
Culture: NO GROWTH

## 2012-06-17 LAB — CBC
HCT: 29.7 % — ABNORMAL LOW (ref 36.0–46.0)
Hemoglobin: 9.9 g/dL — ABNORMAL LOW (ref 12.0–15.0)
MCH: 31.1 pg (ref 26.0–34.0)
MCHC: 33.3 g/dL (ref 30.0–36.0)
MCV: 93.4 fL (ref 78.0–100.0)
RDW: 13.4 % (ref 11.5–15.5)

## 2012-06-17 LAB — BASIC METABOLIC PANEL
BUN: 6 mg/dL (ref 6–23)
Calcium: 8.5 mg/dL (ref 8.4–10.5)
Creatinine, Ser: 0.84 mg/dL (ref 0.50–1.10)
GFR calc non Af Amer: 71 mL/min — ABNORMAL LOW (ref >90)
Glucose, Bld: 122 mg/dL — ABNORMAL HIGH (ref 70–99)
Potassium: 3.5 mEq/L (ref 3.5–5.1)

## 2012-06-17 NOTE — Progress Notes (Signed)
Report called to [employer]. Pt to be transferred to 2004. Pt without complaints.

## 2012-06-17 NOTE — Evaluation (Signed)
Physical Therapy Evaluation Patient Details Name: Anna Barrera MRN: [id number] DOB: [birth year] Today's Date: 06/17/2012 Time: 4098-1191 PT Time Calculation (min): 21 min  PT Assessment / Plan / Recommendation Clinical Impression  pt presents with R Fem-pop.  pt very painful and requiring A for all mobility.  pt notes her son is able to provide A and thinks she can borrow DME from family.  pt will need RW if unable to get one from family.      PT Assessment  Patient needs continued PT services    Follow Up Recommendations  Home health PT;Supervision/Assistance - 24 hour    Barriers to Discharge None      Equipment Recommendations  Rolling walker with 5" wheels (if can't get one from family)    Recommendations for Other Services     Frequency Min 3X/week    Precautions / Restrictions Precautions Precautions: Fall Restrictions Weight Bearing Restrictions: No   Pertinent Vitals/Pain Does not rate, but indicates very painful in R LE.        Mobility  Bed Mobility Bed Mobility: Sit to Supine Sitting - Scoot to Edge of Bed: 4: Min assist Sit to Supine: 1: +2 Total assist Sit to Supine: Patient Percentage: 70% Details for Bed Mobility Assistance: cues for sequencing, safe technique.  A with R LE.   Transfers Transfers: Sit to Stand;Stand to [employer] Transfers Sit to Stand: 4: Min assist;With upper extremity assist;From chair/3-in-1 Stand to Sit: 4: Min assist;With upper extremity assist;To bed Stand Pivot Transfers: 4: Min assist Details for Transfer Assistance: cues for use of UEs, positioning of R LE, use of RW, and sequencing through pivot.   Ambulation/Gait Ambulation/Gait Assistance: Not tested (comment) Stairs: No Wheelchair Mobility Wheelchair Mobility: No    Exercises     PT Diagnosis: Difficulty walking;Acute pain  PT Problem List: Decreased strength;Decreased range of motion;Decreased activity tolerance;Decreased balance;Decreased  mobility;Decreased knowledge of use of DME;Pain PT Treatment Interventions: DME instruction;Gait training;Stair training;Functional mobility training;Therapeutic activities;Therapeutic exercise;Balance training;Patient/family education   PT Goals Acute Rehab PT Goals PT Goal Formulation: With patient Time For Goal Achievement: 07/01/12 Potential to Achieve Goals: Good Pt will go Supine/Side to Sit: with modified independence PT Goal: Supine/Side to Sit - Progress: Goal set today Pt will go Sit to Supine/Side: with modified independence PT Goal: Sit to Supine/Side - Progress: Goal set today Pt will go Sit to Stand: with modified independence PT Goal: Sit to Stand - Progress: Goal set today Pt will go Stand to Sit: with modified independence PT Goal: Stand to Sit - Progress: Goal set today Pt will Ambulate: >150 feet;with supervision;with rolling walker PT Goal: Ambulate - Progress: Goal set today Pt will Go Up / Down Stairs: 3-5 stairs;with min assist;with rail(s) PT Goal: Up/Down Stairs - Progress: Goal set today  Visit Information  Last PT Received On: 06/17/12 Assistance Needed: +1 PT/OT Co-Evaluation/Treatment: Yes    Subjective Data  Subjective: I've been up since 5 this morning.   Patient Stated Goal: Work in the yard   Prior [employer] Living Lives With: Son Available Help at Discharge: Available 24 hours/day Type of Home: House Home Access: Stairs to enter Secretary/administrator of Steps: 5 Entrance Stairs-Rails: Left;Right Home Layout: One level Bathroom Shower/Tub: Forensic scientist: Standard Bathroom Accessibility: Yes How Accessible: Accessible via walker;Accessible via wheelchair Home Adaptive Equipment: Crutches;Straight cane (can borrow 3N1) Prior Function Level of Independence: Independent Able to Take Stairs?: Yes Driving: No (son drives) Vocation: Retired Musician:  No difficulties Dominant Hand: Right      Cognition  Overall Cognitive Status: Appears within functional limits for tasks assessed/performed Arousal/Alertness: Awake/alert Orientation Level: Appears intact for tasks assessed Behavior During Session: [facility] for tasks performed    Extremity/Trunk Assessment Right Upper Extremity Assessment RUE ROM/Strength/Tone: Within functional levels RUE Sensation: WFL - Light Touch RUE Coordination: WFL - gross/fine motor Left Upper Extremity Assessment LUE ROM/Strength/Tone: Within functional levels LUE Sensation: WFL - Light Touch LUE Coordination: WFL - gross/fine motor Right Lower Extremity Assessment RLE ROM/Strength/Tone: Deficits RLE ROM/Strength/Tone Deficits: pt very painful and edematous limiting ROM and strength RLE Sensation: Deficits RLE Sensation Deficits: Diminished to soft touch Left Lower Extremity Assessment LLE ROM/Strength/Tone: WFL for tasks assessed LLE Sensation: Deficits LLE Sensation Deficits: Diminished to soft touch Trunk Assessment Trunk Assessment: Normal   Balance Balance Balance Assessed: No  End of Session PT - End of Session Equipment Utilized During Treatment: Gait belt Activity Tolerance: Patient limited by pain Patient left: in bed;with call bell/phone within reach Nurse Communication: Mobility status  GP     Sunny Schlein, [place] [phone] 06/17/2012, 8:41 AM

## 2012-06-17 NOTE — Progress Notes (Signed)
VASCULAR PROGRESS NOTE  SUBJECTIVE: No complaints.  PHYSICAL EXAM: Filed Vitals:   06/16/12 1601 06/16/12 2000 06/17/12 0005 06/17/12 0431  BP:  118/60 120/62 116/59  Pulse:  101 82 81  Temp: 97.8 F (36.6 C) 97.6 F (36.4 C) 99 F (37.2 C) 98.3 F (36.8 C)  TempSrc: Oral Oral Oral Oral  Resp:  16 24 18   Height:      Weight:      SpO2:  98% 98% 100%   Palpable right popliteal pulse. Brisk anterior tibial signal with the Doppler. Foot is hyperemic. Incisions look fine.  LABS: Lab Results  Component Value Date   WBC 10.1 06/17/2012   HGB 9.9* 06/17/2012   HCT 29.7* 06/17/2012   MCV 93.4 06/17/2012   PLT 188 06/17/2012   Lab Results  Component Value Date   CREATININE 0.84 06/17/2012   ASSESSMENT/PLAN: 1. 1 Day Post-Op s/p: right femoral to below knee popliteal artery bypass graft with vein. 2. Tobacco cessation. 3. Transferred to 2000. 4. Ambulate. 5. Possibly home tomorrow.  Waverly Ferrari, MD, FACS Beeper: [phone] 06/17/2012

## 2012-06-17 NOTE — Evaluation (Signed)
Occupational Therapy Evaluation Patient Details Name: Anna Barrera MRN: [id number] DOB: 1946-05-03 Today's Date: 06/17/2012 Time: 4098-1191 OT Time Calculation (min): 21 min  OT Assessment / Plan / Recommendation Clinical Impression  66 yo female with R Fem-pop.  pt very painful and requiring A for all mobility.  pt notes her son is able to provide A and thinks she can borrow DME from family. Ot to follow acutely    OT Assessment  Patient needs continued OT Services    Follow Up Recommendations  Home health OT    Barriers to Discharge      Equipment Recommendations  Rolling walker with 5" wheels (if can't get one from family)    Recommendations for Other Services    Frequency  Min 2X/week    Precautions / Restrictions Precautions Precautions: Fall Restrictions Weight Bearing Restrictions: No   Pertinent Vitals/Pain 8 out 10    ADL  Eating/Feeding: Performed;Modified independent Where Assessed - Eating/Feeding: Bed level Grooming: Performed;Wash/dry hands;Modified independent Where Assessed - Grooming: Supine, head of bed up (wet wipe) Lower Body Bathing: Performed;Minimal assistance Where Assessed - Lower Body Bathing: Supported standing Upper Body Dressing: Performed;Moderate assistance Where Assessed - Upper Body Dressing: Supported sitting ((A) due to lines and leads) Toilet Transfer: Performed;Moderate assistance Toilet Transfer Method: Sit to stand Toilet Transfer Equipment: Raised toilet seat with arms (or 3-in-1 over toilet) Toileting - Clothing Manipulation and Hygiene: Performed;Minimal assistance Where Assessed - Toileting Clothing Manipulation and Hygiene: Sit to stand from 3-in-1 or toilet Equipment Used: Rolling walker ADL Comments: Pt on 3N1 on arrival. Pt agreeable to (A) from therapist. Pt performed hygiene sitting and then washing peri area with wash cloth standing with RW. Pt stand pivot to bed . Pt reports sitting in chair for 2 hrs this am. Pt  positioned and given breakfast. Pt with decreased pain positioned supine.    OT Diagnosis: Generalized weakness;Acute pain  OT Problem List: Decreased strength;Decreased activity tolerance;Impaired balance (sitting and/or standing);Decreased safety awareness;Decreased knowledge of use of DME or AE;Decreased knowledge of precautions;Pain OT Treatment Interventions: Self-care/ADL training;DME and/or AE instruction;Therapeutic activities;Patient/family education;Balance training   OT Goals Acute Rehab OT Goals OT Goal Formulation: With patient Time For Goal Achievement: 07/01/12 Potential to Achieve Goals: Good ADL Goals Pt Will Perform Grooming: with modified independence;Sitting at sink;Unsupported ADL Goal: Grooming - Progress: Goal set today Pt Will Perform Upper Body Bathing: with set-up;Sitting at sink;Unsupported ADL Goal: Upper Body Bathing - Progress: Goal set today Pt Will Perform Lower Body Bathing: with min assist;Sitting at sink ADL Goal: Lower Body Bathing - Progress: Goal set today Pt Will Perform Upper Body Dressing: with set-up;Sit to stand from chair ADL Goal: Upper Body Dressing - Progress: Goal set today Pt Will Perform Lower Body Dressing: with min assist;Sit to stand from chair ADL Goal: Lower Body Dressing - Progress: Goal set today Pt Will Transfer to Toilet: with set-up;3-in-1;Ambulation ADL Goal: Toilet Transfer - Progress: Goal set today Pt Will Perform Toileting - Hygiene: with set-up;Standing at 3-in-1/toilet ADL Goal: Toileting - Hygiene - Progress: Goal set today Miscellaneous OT Goals Miscellaneous OT Goal #1: Pt will perform bed mobility mod i as precursor to adls OT Goal: Miscellaneous Goal #1 - Progress: Goal set today  Visit Information  Last OT Received On: 06/17/12 Assistance Needed: +1 PT/OT Co-Evaluation/Treatment: Yes    Subjective Data  Subjective: "They said sit up for 2 hours"- pt reports sitting in chair since 6am Patient Stated Goal:  working in the yard  Prior Functioning  Vision/Perception  Home Living Lives With: Son Available Help at Discharge: Available 24 hours/day Type of Home: House Home Access: Stairs to enter [employer] of Steps: 5 Entrance Stairs-Rails: Left;Right Home Layout: One level Bathroom Shower/Tub: Forensic scientist: Standard Bathroom Accessibility: Yes How Accessible: Accessible via walker;Accessible via wheelchair Home Adaptive Equipment: Crutches;Straight cane (can borrow 3N1) Prior Function Level of Independence: Independent Able to Take Stairs?: Yes Driving: No (son drives) Vocation: Retired Musician: No difficulties Dominant Hand: Right      Cognition  Overall Cognitive Status: Appears within functional limits for tasks assessed/performed Arousal/Alertness: Awake/alert Orientation Level: Appears intact for tasks assessed Behavior During Session: [facility] for tasks performed    Extremity/Trunk Assessment Right Upper Extremity Assessment RUE ROM/Strength/Tone: Within functional levels RUE Sensation: WFL - Light Touch RUE Coordination: WFL - gross/fine motor Left Upper Extremity Assessment LUE ROM/Strength/Tone: Within functional levels LUE Sensation: WFL - Light Touch LUE Coordination: WFL - gross/fine motor Right Lower Extremity Assessment RLE ROM/Strength/Tone: Deficits RLE ROM/Strength/Tone Deficits: pt very painful and edematous limiting ROM and strength RLE Sensation: Deficits RLE Sensation Deficits: Diminished to soft touch Left Lower Extremity Assessment LLE ROM/Strength/Tone: WFL for tasks assessed LLE Sensation: Deficits LLE Sensation Deficits: Diminished to soft touch Trunk Assessment Trunk Assessment: Normal   Mobility Bed Mobility Bed Mobility: Sit to Supine Sitting - Scoot to Edge of Bed: 4: Min assist Sit to Supine: 1: +2 Total assist Sit to Supine: Patient Percentage: 70% Details for Bed Mobility  Assistance: cues for sequencing, safe technique.  A with R LE.   Transfers Transfers: Sit to Stand;Stand to Sit Sit to Stand: 4: Min assist;With upper extremity assist;From chair/3-in-1 Stand to Sit: 4: Min assist;With upper extremity assist;To bed Details for Transfer Assistance: cues for use of UEs, positioning of R LE, use of RW, and sequencing through pivot.     Exercise    Balance Balance Balance Assessed: No Static Sitting Balance Static Sitting - Balance Support: No upper extremity supported;Feet supported Static Sitting - Level of Assistance: 5: Stand by assistance Static Standing Balance Static Standing - Balance Support: Bilateral upper extremity supported;During functional activity Static Standing - Level of Assistance: 4: Min assist  End of Session OT - End of Session Activity Tolerance: Patient tolerated treatment well Patient left: in bed;with call bell/phone within reach Nurse Communication: Mobility status  GO     Harrel Carina [facility] 06/17/2012, 8:46 AM Pager: [phone]

## 2012-06-18 DIAGNOSIS — Z48812 Encounter for surgical aftercare following surgery on the circulatory system: Secondary | ICD-10-CM

## 2012-06-18 NOTE — Progress Notes (Signed)
VASCULAR PROGRESS NOTE  SUBJECTIVE: No complaints, but did not ambulate yesterday.  PHYSICAL EXAM: Filed Vitals:   06/17/12 0737 06/17/12 1045 06/17/12 2124 06/18/12 0402  BP:  97/58 105/55 97/60  Pulse: 76 66 76 75  Temp:  98.9 F (37.2 C) 99.4 F (37.4 C) 99.3 F (37.4 C)  TempSrc:  Oral Oral Oral  Resp: 18 18 18 18   Height:      Weight:      SpO2: 92% 95% 95% 96%   Incisions look fine. Palpable right popliteal pulse Right foot hyperemic  ASSESSMENT/PLAN: 1. 2 Days Post-Op s/p: Right fempop 2. Plan D/C tomorrow if able to ambulate.   Waverly Ferrari, MD, FACS Beeper: [phone] 06/18/2012

## 2012-06-18 NOTE — Progress Notes (Signed)
Physical Therapy Treatment Patient Details Name: LAMESHA TIBBITS MRN: [id number] DOB: 07/29/1946 Today's Date: 06/18/2012 Time: 2956-2130 PT Time Calculation (min): 19 min  PT Assessment / Plan / Recommendation Comments on Treatment Session  pt presents with R Fem-pop.  pt needs Max encouragement and stern pt ed about need for mobility, and then distractions during ambulation to encourage increased distance.  pt moves well once she is motivated to get up.      Follow Up Recommendations  Home health PT;Supervision/Assistance - 24 hour    Barriers to Discharge        Equipment Recommendations  Rolling walker with 5" wheels    Recommendations for Other Services    Frequency Min 3X/week   Plan Discharge plan remains appropriate;Frequency remains appropriate    Precautions / Restrictions Precautions Precautions: Fall Restrictions Weight Bearing Restrictions: No   Pertinent Vitals/Pain Denies pain.      Mobility  Bed Mobility Bed Mobility: Sit to Supine Sitting - Scoot to Edge of Bed: 5: Supervision Details for Bed Mobility Assistance: able to get LEs in bed by herself and scoot to [facility] independently. Transfers Transfers: Sit to Stand;Stand to Sit Sit to Stand: 4: Min guard;With upper extremity assist;From chair/3-in-1;From toilet Stand to Sit: 4: Min guard;To toilet;To bed Details for Transfer Assistance: cues for UE use and positioning prior to sitting on bed.   Ambulation/Gait Ambulation/Gait Assistance: 4: Min guard Ambulation Distance (Feet): 40 Feet Assistive device: Rolling walker Ambulation/Gait Assistance Details: Max encouragement and distraction to motivate pt to ambulate in hallway.   Gait Pattern: Step-through pattern;Decreased stride length Stairs: No Wheelchair Mobility Wheelchair Mobility: No    Exercises     PT Diagnosis:    PT Problem List:   PT Treatment Interventions:     PT Goals Acute Rehab PT Goals Time For Goal Achievement:  07/01/12 Potential to Achieve Goals: Good PT Goal: Sit to Supine/Side - Progress: Progressing toward goal PT Goal: Sit to Stand - Progress: Progressing toward goal PT Goal: Stand to Sit - Progress: Progressing toward goal PT Goal: Ambulate - Progress: Progressing toward goal  Visit Information  Last PT Received On: 06/18/12 Assistance Needed: +1 PT/OT Co-Evaluation/Treatment: Yes    Subjective Data  Subjective: I want to get back to bed now.     Cognition  Overall Cognitive Status: Appears within functional limits for tasks assessed/performed Arousal/Alertness: Awake/alert Orientation Level: Appears intact for tasks assessed Behavior During Session: [facility] for tasks performed    Balance  Balance Balance Assessed: No  End of Session PT - End of Session Equipment Utilized During Treatment: Gait belt Activity Tolerance: Patient tolerated treatment well Patient left: in bed;with call bell/phone within reach Nurse Communication: Mobility status   GP     Sunny Schlein, [place] [phone] 06/18/2012, 2:33 PM

## 2012-06-18 NOTE — Care Management Note (Signed)
    Page 1 of 2   06/19/2012     11:10:26 AM   CARE MANAGEMENT NOTE 06/19/2012  Patient:  Anna Barrera, Anna Barrera   Account Number:  [id number]  Date Initiated:  06/18/2012  Documentation initiated by:  [facility]  Subjective/Objective Assessment:   Post op Right fem to below knee pop BPG.     Action/Plan:   PHYSICAL THERAPY RECOMMENDING HHPT AND PT AGREEABLE TO HOME THERAPY.   Anticipated DC Date:  06/19/2012   Anticipated DC Plan:  HOME W HOME HEALTH SERVICES      DC Planning Services  CM consult      [facility] Choice  HOME HEALTH   Choice offered to / List presented to:  C-1 Patient   DME arranged  3-N-1      DME agency  [facility]     HH arranged  HH-2 PT      [facility] agency  [facility]   Status of service:  Completed, signed off Medicare Important Message given?   (If response is "NO", the following Medicare IM given date fields will be blank) Date Medicare IM given:   Date Additional Medicare IM given:    Discharge Disposition:  HOME W HOME HEALTH SERVICES  Per UR Regulation:  Reviewed for med. necessity/level of care/duration of stay  If discussed at Long Length of Stay Meetings, dates discussed:    Comments:  Contact:  [place], [place] [phone]  06/19/12 Anesha Hackert,RN,BSN 1030 ORDERS FOR HHPT AND BSC OBTAINED FROM PA.  PT FOR DC HOME TODAY.  PT HAS NEEDED DME IN ROOM.  NOTIFIED [facility] OF DC HOME TODAY.  START OF CARE 24-48H POST DC DATE.  06-18-12 2:40pm Avie Arenas, RNBSN [phone] Talked with patient.  has walker at home she can use. Asking for 3-n-1 to use as shower chair and BSC. Due to uninsured would like just HHPT.   Referral placed to [facility]. Will need HH PT ordered and BSC order.

## 2012-06-18 NOTE — Progress Notes (Signed)
Occupational Therapy Treatment Patient Details Name: Anna Barrera MRN: [id number] DOB: Mar 07, 1946 Today's Date: 06/18/2012 Time: 4132-4401 OT Time Calculation (min): 15 min  OT Assessment / Plan / Recommendation Comments on Treatment Session Pt tolerating activity with several brief standing rest breaks initiated by pt.  Pt seemed surprised that mobility was pain-free.  Motivated to return home with assist of son.    Follow Up Recommendations  Home health OT    Barriers to Discharge       Equipment Recommendations  Rolling walker with 5" wheels    Recommendations for Other Services    Frequency Min 2X/week   Plan Discharge plan remains appropriate    Precautions / Restrictions Precautions Precautions: Fall Restrictions Weight Bearing Restrictions: No   Pertinent Vitals/Pain No pain    ADL  Grooming: Performed;Wash/dry hands;Min guard Where Assessed - Grooming: Unsupported standing Upper Body Dressing: Performed;Minimal assistance Where Assessed - Upper Body Dressing: Unsupported sitting Toilet Transfer: Performed;Min guard Toilet Transfer Method: Sit to Barista: Regular height toilet;Grab bars Toileting - Clothing Manipulation and Hygiene: Performed;Min guard Where Assessed - Engineer, mining and Hygiene: Sit to stand from 3-in-1 or toilet Equipment Used: Gait belt;Rolling walker Transfers/Ambulation Related to ADLs: Pt ambulated with RW and min guard assist without pain. ADL Comments: Pt requiring encouragement to ambulate.    OT Diagnosis:    OT Problem List:   OT Treatment Interventions:     OT Goals Acute Rehab OT Goals OT Goal Formulation: With patient Time For Goal Achievement: 07/01/12 Potential to Achieve Goals: Good ADL Goals Pt Will Perform Grooming: with modified independence;Sitting at sink;Unsupported ADL Goal: Grooming - Progress: Progressing toward goals Pt Will Perform Upper Body Dressing: with  set-up;Sit to stand from chair ADL Goal: Upper Body Dressing - Progress: Progressing toward goals Pt Will Transfer to Toilet: with set-up;3-in-1;Ambulation ADL Goal: Toilet Transfer - Progress: Progressing toward goals Pt Will Perform Toileting - Hygiene: with set-up;Standing at 3-in-1/toilet ADL Goal: Toileting - Hygiene - Progress: Progressing toward goals Miscellaneous OT Goals Miscellaneous OT Goal #1: Pt will perform bed mobility mod i as precursor to adls OT Goal: Miscellaneous Goal #1 - Progress: Progressing toward goals  Visit Information  Last OT Received On: 06/18/12 Assistance Needed: +1 PT/OT Co-Evaluation/Treatment: Yes    Subjective Data      Prior Functioning       Cognition  Overall Cognitive Status: Appears within functional limits for tasks assessed/performed Arousal/Alertness: Awake/alert Orientation Level: Appears intact for tasks assessed Behavior During Session: [facility] for tasks performed    Mobility Bed Mobility Bed Mobility: Sit to Supine Sitting - Scoot to Edge of Bed: 5: Supervision Details for Bed Mobility Assistance: able to get LEs in bed by herself and scoot to [facility] independently. Transfers Transfers: Sit to Stand;Stand to Sit Sit to Stand: 4: Min guard;With upper extremity assist;From chair/3-in-1;From toilet Stand to Sit: 4: Min guard;To toilet;To bed   Exercises    Balance    End of Session OT - End of Session Activity Tolerance: Patient tolerated treatment well Patient left: in bed;with call bell/phone within reach  GO     Evern Bio 06/18/2012, 1:48 PM [phone]

## 2012-06-18 NOTE — Progress Notes (Signed)
VASCULAR LAB PRELIMINARY  ARTERIAL  ABI completed: Right ABI is within normal limits. Left ABI demonstrates moderate disease.    RIGHT    LEFT    PRESSURE WAVEFORM  PRESSURE WAVEFORM  BRACHIAL 114 Tri  BRACHIAL 116 Tri   DP   DP    AT 116 Bi  AT 63 Mono   PT 112 Bi  PT 71 Bi   PER   PER    GREAT TOE  NA GREAT TOE  NA    RIGHT LEFT  ABI 0.6 0.98     Farrel Demark, RDMS, RVT  06/18/2012, 12:18 PM

## 2012-06-19 MED ORDER — HYDROCODONE-ACETAMINOPHEN 5-325 MG PO TABS: 1.0000 | ORAL_TABLET | ORAL | Status: DC | PRN

## 2012-06-19 NOTE — Progress Notes (Signed)
Pt discharge instructions and patient education complete. IV site d/c. Site WNL. BSC at bedside. HH PT arranged. No further questions. D/C home with family. Dion Saucier

## 2012-06-19 NOTE — Progress Notes (Signed)
VASCULAR PROGRESS NOTE  SUBJECTIVE: No complaints. Ambulated yesterday.   PHYSICAL EXAM: Filed Vitals:   06/18/12 1300 06/18/12 1700 06/18/12 1931 06/19/12 0358  BP: 101/61 112/71 107/69 114/68  Pulse: 77 94 80 86  Temp: 97.8 F (36.6 C)  98.6 F (37 C) 100.3 F (37.9 C)  TempSrc: Oral  Oral Oral  Resp: 18  18 18   Height:      Weight:      SpO2: 92%  94% 93%   Incisions fine. Right foot warm  ASSESSMENT/PLAN: 1. 3 Days Post-Op s/p: right fempop 2. Home today.  3. I will arrange F/U in 3-4 weeks.  Waverly Ferrari, MD, FACS Beeper: [phone] 06/19/2012

## 2012-06-19 NOTE — Discharge Summary (Signed)
Patient ID: Anna Barrera MRN: [id number] DOB/AGE: 12/30/1945 66 y.o.  Admit date: 06/16/2012 Discharge date: 06/19/2012  Admission Diagnosis: CLAUDICATION  Discharge Diagnoses:  CLAUDICATION  Secondary Diagnoses: Past Medical History  Diagnosis Date  . Seasonal allergies   . Perforated bowel   . Complication of anesthesia     "hard time waking up"  . Peripheral vascular disease   . Depression   . Hay fever   . Constipation   . Anemia     Procedures: 06/16/2012 Surgeon(s): Chuck Hint, MD Procedure(s): RIGHT FEMORAL-BELOW KNEE POPLITEAL BYPASS. INTRA OPERATIVE ARTERIOGRAM  Discharged Condition: good  HPI: Anna Barrera is a 66 y.o. female who developed discoloration of her right first and third toes approximately 4 weeks ago. Her pain in her toes at progressed and she was admitted with ischemic toes of the right foot. Prior to developing the discoloration of her toes, she does give a long history of right calf claudication. Her symptoms are brought on by ambulation and relieved with rest. She's had no significant claudication on the left side. She does describe some early rest pain in the right foot. She's had no nonhealing ulcers that she is aware of. She is brought in for elective fem pop bypass on the right.    Hospital Course: The patient underwent uneventful right fem BK pop bypass with vein on 06/16/12. She was monitored in the step down unit and transferred to a regular bed on POD 1. She had a good popliteal pulse post op. Post op doppler study shows biphasic doppler signals in the right foot with an ABI of 98%. (ABI on the left is 60%). By POD 3 she was ambulating and ready for D/C.   Consults:     Significant Diagnostic Studies:  No results found.  CBC    Component Value Date/Time   WBC 10.1 06/17/2012 0444   RBC 3.18* 06/17/2012 0444   HGB 9.9* 06/17/2012 0444   HCT 29.7* 06/17/2012 0444   PLT 188 06/17/2012 0444   MCV 93.4 06/17/2012 0444     MCH 31.1 06/17/2012 0444   MCHC 33.3 06/17/2012 0444   RDW 13.4 06/17/2012 0444   LYMPHSABS 2.0 06/10/2012 0540   MONOABS 0.6 06/10/2012 0540   EOSABS 0.2 06/10/2012 0540   BASOSABS 0.1 06/10/2012 0540    BMET    Component Value Date/Time   NA 141 06/17/2012 0444   K 3.5 06/17/2012 0444   CL 106 06/17/2012 0444   CO2 26 06/17/2012 0444   GLUCOSE 122* 06/17/2012 0444   BUN 6 06/17/2012 0444   CREATININE 0.84 06/17/2012 0444   CALCIUM 8.5 06/17/2012 0444   GFRNONAA 71* 06/17/2012 0444   GFRAA 83* 06/17/2012 0444    COAG Lab Results  Component Value Date   INR 1.00 06/15/2012   No results found for this basename: PTT    Disposition: 01-Home or Self Care  Discharge Orders    Future Orders Please Complete By Expires   Resume previous diet      Driving Restrictions      Comments:   No driving   Call MD for:  temperature >100.5      Call MD for:  redness, tenderness, or signs of infection (pain, swelling, bleeding, redness, odor or green/yellow discharge around incision site)      Call MD for:  severe or increased pain, loss or decreased feeling  in affected limb(s)      Increase activity slowly  Comments:   Walk with assistance use walker or cane as needed   May shower       No dressing needed      may wash over wound with mild soap and water      Resume previous diet      Driving Restrictions      Comments:   No driving for 2 weeks   Call MD for:  temperature >100.5      Call MD for:  redness, tenderness, or signs of infection (pain, swelling, bleeding, redness, odor or green/yellow discharge around incision site)      Call MD for:  severe or increased pain, loss or decreased feeling  in affected limb(s)      Discharge instructions      Comments:   The office will call to arrange follow up in 2-3 weeks. Call sooner if problems [phone].     Medication List  As of 06/19/2012  7:29 AM   STOP taking these medications         acetaminophen 500 MG tablet      oxycodone 5  MG capsule         TAKE these medications         aspirin 325 MG EC tablet   Take 325 mg by mouth every 4 (four) hours as needed. For pain      furosemide 20 MG tablet   Commonly known as: LASIX   Take 20 mg by mouth 2 (two) times daily.      HYDROcodone-acetaminophen 5-325 MG per tablet   Commonly known as: NORCO/VICODIN   Take 1 tablet by mouth every 4 (four) hours as needed. For pain      simvastatin 20 MG tablet   Commonly known as: ZOCOR   Take 1 tablet (20 mg total) by mouth daily at 6 PM.      sulfamethoxazole-trimethoprim 800-160 MG per tablet   Commonly known as: BACTRIM DS,SEPTRA DS   Take 1 tablet by mouth 2 (two) times daily.             Signed: DICKSON,CHRISTOPHER S 06/19/2012, 7:29 AM

## 2012-06-23 NOTE — Discharge Summary (Signed)
Anna Barrera, is a 66 y.o. female   DOB 1946/10/09   MRN [id number].   Admission date:  06/09/2012   Discharge Date:  06/12/2012   Primary MD  DEFAULT,PROVIDER, MD   Admitting Physician  Eduard Clos, MD   Admission Diagnosis  Foot infection [686.9] Arterial insufficiency with ischemic ulcer [707.10, 443.9] recheck claudication CLAUDICATION   Discharge Diagnosis      Active Problems:  Arterial insufficiency with ischemic ulcer  Cellulitis of foot  Tobacco abuse  Pre-operative cardiovascular examination  Fever       Past Medical History   Diagnosis  Date   .  Seasonal allergies     .  Perforated bowel     .  No pertinent past medical history         Past Surgical History   Procedure  Date   .  Hernia repair     .  Colostomy     .  Colostomy reversal           Recommendations for PCP to follow Follow urine culture results.         Discharge Condition: Stable     Diet recommendation: See Discharge Instructions below     Consults   Cardiology Vascular surgery     History of present illness and  Hospital Course:  See H&P, Labs, Consult and Test reports for all details in brief, patient was admitted for   66 year old female history of tobacco abuse started experiencing pain and swelling and erythema in the right foot toes 3 weeks ago. Patient was started on antibiotics orally. For last 2 weeks patient has been continuing antibiotics but her pain and erythema persisted and had come to the ER at least 3 times. This is the fourth time patient is coming with pain and erythema but last 2 days patient's toes particularly the middle one of the right foot has become more blackish in color. The pain also has worsened. Patient will be admitted for cellulitis with ischemia of the foot. Patient denies any trauma. Denies any fever chills.   Peripheral arterial disease/Right foot ischemia Patient underwent arteriogram and vascular surgery  determined that she will need a right femoral to below knee popliteal artrey bypass graft. Patient is supposed to go for surgery on Tuesday 06/16/12.   Cellulitis Patient had considerable erythema of the foot on the day of admission, and had been antibiotics for the last two weeks. Patient was started on vancomycin and ciprofloxacin in the hospital, at this time erythema has considerably reduced, will send her home on po bactrim bid for five more days.   Fever Patient had one time fever of 101 in the hospital, all the work up including bllod cultures, UA, CXR has been negative. Fever resolved. Urine culture results are pending. Patient to go home on Bactrim, most likely she had fever due to cellulitis.   Preop cardiac clearance Patient was seen by cardiology, and underwent nuclear myoview  test, at this time cardiology has cleared the patient for surgery and they recommend to continue aspirin. They also started her on zocor.       Today     Subjective:      Anna Barrera today has no complaints. Objective:      Blood pressure 100/45, pulse 88, temperature 98.7 F (37.1 C), temperature source Oral, resp. rate 18, height 5\' 2"  (1.575 m), weight 54.432 kg (120 lb), SpO2 95.00%.   Intake/Output Summary (Last 24 hours) at 06/12/12 1315  Last data filed at 06/12/12 1032   Gross per 24 hour   Intake    3814 ml   Output       0 ml   Net    3814 ml      Exam  Head: Normocephalic, atraumatic.  Eyes: No signs of jaundice, EOMI   Nose: Mucous membranes dry.  Neck: supple,No deformities, masses, or tenderness noted.   Lungs: Normal respiratory effort. Clear bilaterally Heart: Regular RR. S1 and S2 normal   Abdomen: BS normoactive. Soft, Nondistended, non-tender.   Extremities: Right foot cold to touch, bluish discoloration of toes       Data Review    Major procedures and Radiology Reports - PLEASE review detailed and final reports for all details in brief -     arteriogram     Dg Chest 2 View   06/11/2012  *RADIOLOGY REPORT*  Clinical Data: Fever, crackles  CHEST - 2 VIEW  Comparison: Chest x-ray of 08/13 1013  Findings: There is minimal linear atelectasis at the right lung base.  No infiltrate or effusion is seen.  Mediastinal contours appear stable.  The heart is within normal limits in size.  No bony abnormality is seen.  Surgical clips are present in the right upper quadrant from prior cholecystectomy.  IMPRESSION: Mild linear atelectasis at the right lung base.  No active lung disease.  Original Report Authenticated By: Juline Patch, M.D.    Dg Chest 2 View   06/09/2012  *RADIOLOGY REPORT*  Clinical Data: Toe infection.  CHEST - 2 VIEW  Comparison: 12/16/2003  Findings: Improved aeration.  Vascular clips in the right upper abdomen. Lungs clear.  Heart size and pulmonary vascularity normal. No effusion.  Visualized bones unremarkable.  IMPRESSION: No acute disease  Original Report Authenticated By: Osa Craver, M.D.    Nm Myocar Multi W/spect W/wall Motion / Ef   06/11/2012  Clinical Data:  Tobacco use.  Preoperative assessment.  Technique:  Standard myocardial SPECT imaging performed after resting intravenous injection of Tc-18m tetrofosmin.  Subsequently, stress in the form of Lexiscan  was administered under the supervision of the Cardiology staff.  At peak stress, Tc-75m tetrofosmin was injected intravenously and standard myocardial SPECT imaging performed.  Quantitative gated imaging also performed to evaluate left ventricular wall motion and estimate left ventricular ejection fraction.  Radiopharmaceutical: Tc-59m tetrofosmin, 10 mCi at rest and 30 mCi at stress.  Comparison:  None  MYOCARDIAL IMAGING WITH SPECT (REST AND STRESS)  Findings:  Reduced inferior wall activity on stress and rest imaging may well be due to diaphragmatic attenuation although scar can have a similar appearance.  There is considerable activity in bowel  along the left hemidiaphragm, which do not clear even with oral water administration.  There is also considerable breathing motion artifact.  LEFT VENTRICULAR EJECTION FRACTION  Findings:  Left ventricular end-diastolic volume is 41 cc.  End- systolic volume is 19 cc.  Derived LV ejection fraction is 54%.  GATED LEFT VENTRICULAR WALL MOTION STUDY  Findings:  Equivocal hypokinesis along the inferolateral cardiac base noted.  IMPRESSION:  1.  Reduced inferior wall activity on stress rest imaging, probably from diaphragmatic attenuation and less likely due to scar.  No inducible ischemia observed.  Original Report Authenticated By: Dellia Cloud, M.D.    Dg Foot Complete Right   06/09/2012  *RADIOLOGY REPORT*  Clinical Data: Right foot pain especially along the third toe.  RIGHT FOOT COMPLETE - 3+ VIEW  Comparison:  06/03/2012  Findings: No fracture, foreign body, or acute bony findings are identified.  No gas is observed in the soft tissues of the distal foot.  Spurring of the first metatarsal head noted. There is mild soft tissue swelling along the dorsum of the foot distally.  Alignment at the Lisfranc joint appears normal.  IMPRESSION:  1.  Mild dorsal soft tissue swelling along the foot, without acute bony findings or foreign body.  Original Report Authenticated By: Dellia Cloud, M.D.    Dg Foot Complete Right   06/03/2012  *RADIOLOGY REPORT*  Clinical Data: Pain and swelling fourth toe  RIGHT FOOT COMPLETE - 3+ VIEW  Comparison: None.  Findings: Three views of the right foot submitted.  No acute fracture or subluxation.  Spurring noted at the base of fifth metatarsal.  No periosteal reaction or bony erosion.  IMPRESSION: No acute fracture or subluxation.  Original Report Authenticated By: Natasha Mead, M.D.      Micro Results       Recent Results (from the past 240 hour(s))   CULTURE, BLOOD (ROUTINE X 2)     Status: Normal (Preliminary result)     Collection Time     06/11/12  2:57  AM       Component  Value  Range  Status  Comment     Specimen Description  BLOOD RIGHT ARM     Final       Special Requests  BOTTLES DRAWN AEROBIC ONLY 1CC     Final       Culture  Setup Time  06/11/2012 09:02     Final       Culture        Final       Value:         BLOOD CULTURE RECEIVED NO GROWTH TO DATE CULTURE WILL BE HELD FOR 5 DAYS BEFORE ISSUING A FINAL NEGATIVE REPORT     Report Status  PENDING     Incomplete     CULTURE, BLOOD (ROUTINE X 2)     Status: Normal (Preliminary result)     Collection Time     06/11/12  3:20 AM       Component  Value  Range  Status  Comment     Specimen Description  BLOOD RIGHT HAND     Final       Special Requests  BOTTLES DRAWN AEROBIC ONLY 10CC     Final       Culture  Setup Time  06/11/2012 09:02     Final       Culture        Final       Value:         BLOOD CULTURE RECEIVED NO GROWTH TO DATE CULTURE WILL BE HELD FOR 5 DAYS BEFORE ISSUING A FINAL NEGATIVE REPORT     Report Status  PENDING     Incomplete           CBC w Diff: Lab Results   Component  Value  Date     WBC  8.6  06/12/2012     HGB  11.5*  06/12/2012     HCT  33.8*  06/12/2012     PLT  182  06/12/2012     LYMPHOPCT  26  06/10/2012     MONOPCT  8  06/10/2012     EOSPCT  2  06/10/2012     BASOPCT  1  06/10/2012  CMP: Lab Results   Component  Value  Date     NA  143  06/10/2012     K  3.7  06/10/2012     CL  102  06/10/2012     CO2  32  06/10/2012     BUN  7  06/10/2012     CREATININE  0.86  06/10/2012     PROT  6.5  06/10/2012     ALBUMIN  3.3*  06/10/2012     BILITOT  0.4  06/10/2012     ALKPHOS  71  06/10/2012     AST  37  06/10/2012     ALT  19  06/10/2012    .     Discharge Instructions      Follow up Dr Edilia Bo on 06/16/12 for surgery at [facility]    Follow-up Information      Follow up with Chuck Hint, MD on 06/16/2012. (Come to [facility])      Contact information:     [address]  [place] [address] [phone]                    Discharge Medications     Medication List   As of 06/12/2012  1:15 PM     START taking these medications              oxyCODONE 5 MG immediate release tablet     Commonly known as: Oxy IR/ROXICODONE     Take 1 tablet (5 mg total) by mouth every 6 (six) hours as needed.           simvastatin 20 MG tablet     Commonly known as: ZOCOR     Take 1 tablet (20 mg total) by mouth daily at 6 PM.           sulfamethoxazole-trimethoprim 800-160 MG per tablet     Commonly known as: BACTRIM DS,SEPTRA DS     Take 1 tablet by mouth 2 (two) times daily.                CONTINUE taking these medications              acetaminophen 500 MG tablet     Commonly known as: TYLENOL           aspirin 325 MG EC tablet           furosemide 20 MG tablet     Commonly known as: LASIX                STOP taking these medications              cephALEXin 500 MG capsule                 Where to get your medications      These are the prescriptions that you need to pick up.     You may get these medications from any pharmacy.              oxyCODONE 5 MG immediate release tablet     simvastatin 20 MG tablet     sulfamethoxazole-trimethoprim 800-160 MG per tablet                          Total Time in preparing paper work, data evaluation and todays exam - 35 minutes   Damion Kant S M.D on 06/12/2012  at 1:15 PM   [facility] Office  [phone]

## 2014-05-21 IMAGING — CR DG CHEST 2V
2 series · 2 of 2 positions shown · non-contrast
Comparison: Chest x-ray of [DATE]

CLINICAL DATA: Fever, crackles

CHEST - 2 VIEW

[w chest pa]
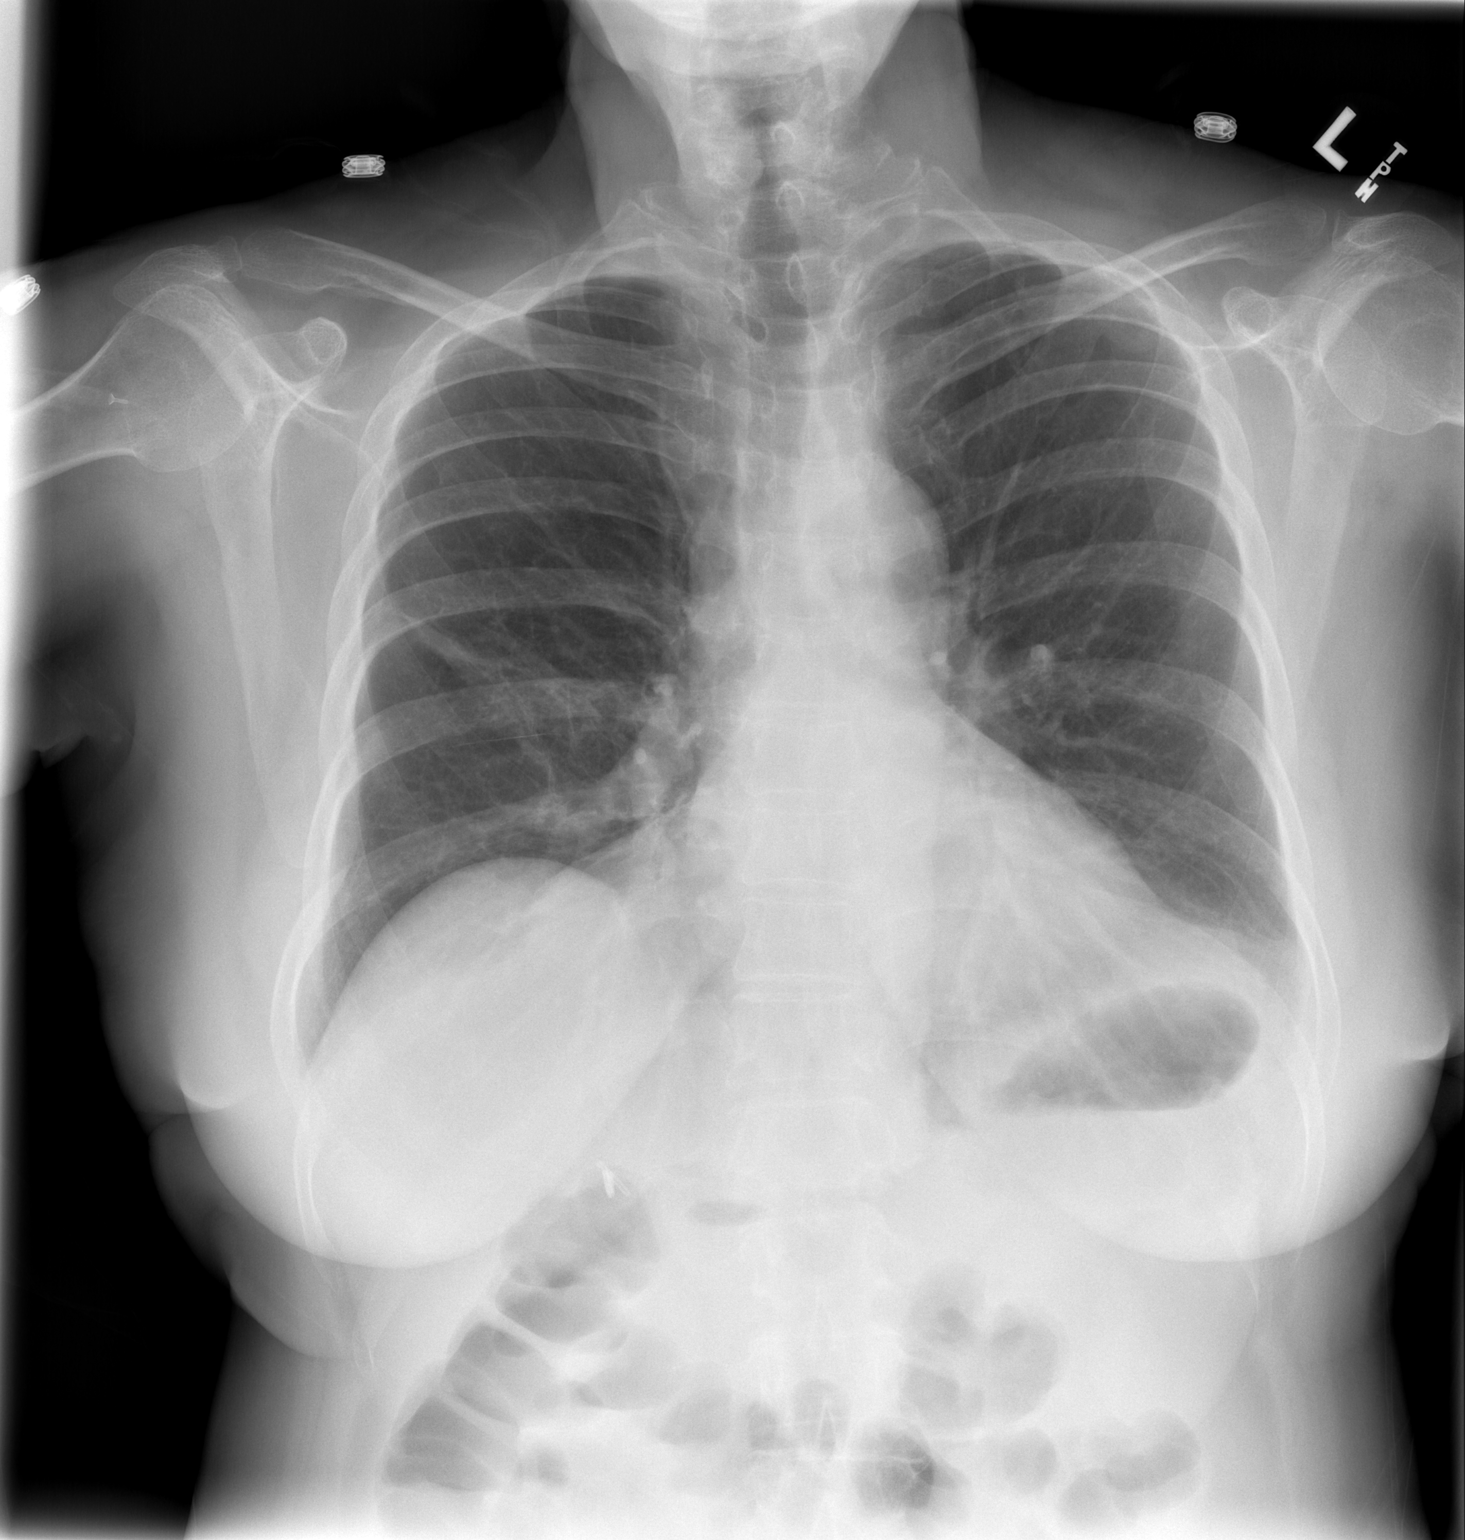

[w chest lat]
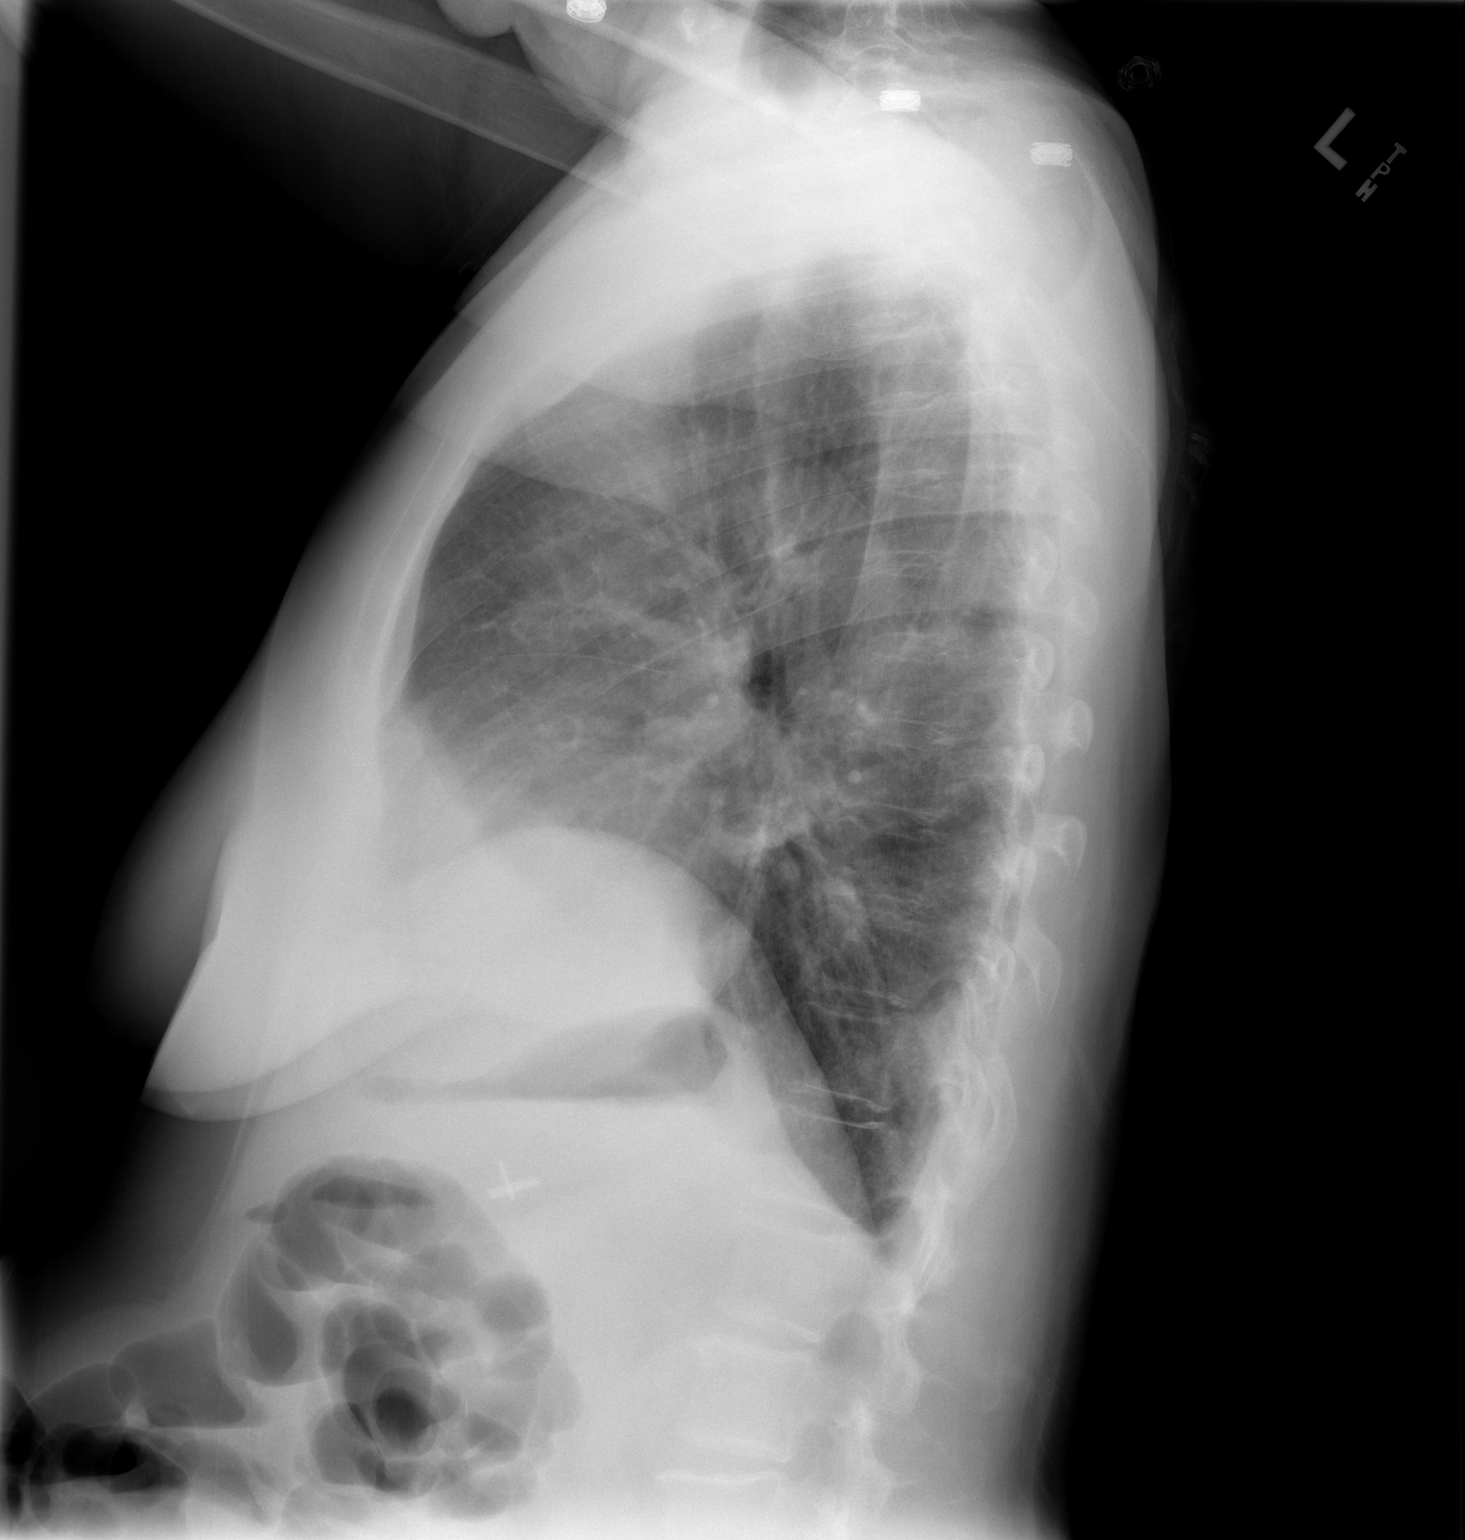

[2 of 2 positions shown; findings below may reference images not displayed]

FINDINGS: There is minimal linear atelectasis at the right lung
base.  No infiltrate or effusion is seen.  Mediastinal contours
appear stable.  The heart is within normal limits in size.  No bony
abnormality is seen.  Surgical clips are present in the right upper
quadrant from prior cholecystectomy.
IMPRESSION: Mild linear atelectasis at the right lung base.  No active lung
disease.

## 2014-05-26 IMAGING — CR DG ANG/EXT/UNI/OR RIGHT
1 series · 1 of 1 positions shown · non-contrast
Comparison: None.

CLINICAL DATA: Peripheral vascular disease.  Ischemic toes.

RIGHT QUIRIJN/EXT/UNI/ OR
TECHNIQUE: Single image intraoperative angiogram of the right leg

[AP]
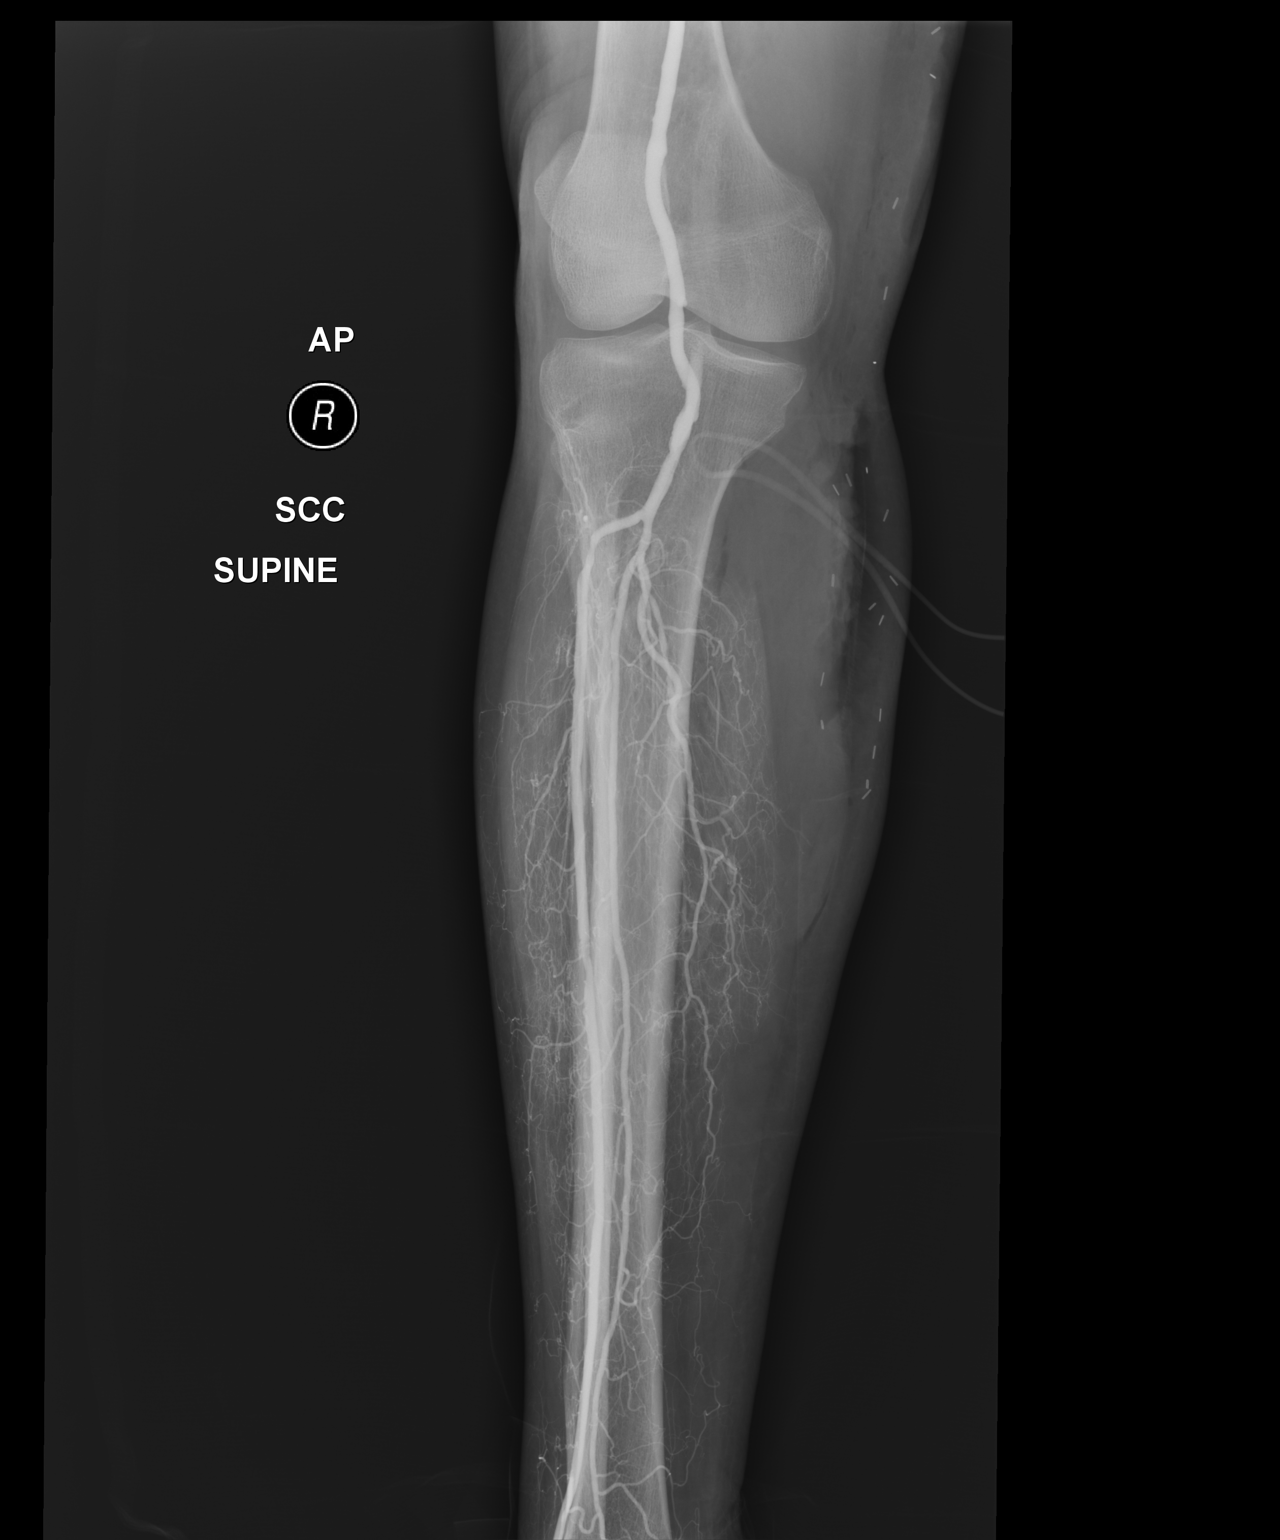

[1 of 1 positions shown; findings below may reference images not displayed]

FINDINGS: Femoral popliteal artery bypass graft below the knee is
patent.  Anastomosis to the popliteal artery is patent.  Anterior
tibial artery is patent with mild disease in the mid leg.  Peroneal
artery is patent.  Posterior tibial arteries occluded.
IMPRESSION: Femoral popliteal artery bypass graft is patent with two-vessel
runoff.

## 2014-10-06 ENCOUNTER — Encounter (HOSPITAL_COMMUNITY): Payer: Self-pay | Admitting: Surgery

## 2015-05-09 ENCOUNTER — Other Ambulatory Visit: Payer: Self-pay | Admitting: Gastroenterology

## 2015-05-09 DIAGNOSIS — R140 Abdominal distension (gaseous): Secondary | ICD-10-CM

## 2015-05-11 ENCOUNTER — Ambulatory Visit
Admission: RE | Admit: 2015-05-11 | Discharge: 2015-05-11 | Disposition: A | Discharge status: Home / Self Care | Payer: PPO | Source: Ambulatory Visit | Attending: Gastroenterology | Admitting: Gastroenterology

## 2015-05-11 DIAGNOSIS — R140 Abdominal distension (gaseous): Secondary | ICD-10-CM

## 2015-05-11 MED ORDER — IOPAMIDOL (ISOVUE-300) INJECTION 61%
100.0000 mL | Freq: Once | INTRAVENOUS | Status: AC | PRN
  Administered 2015-05-11: 100 mL via INTRAVENOUS

## 2015-05-19 ENCOUNTER — Emergency Department (HOSPITAL_COMMUNITY)
Admission: EM | Admit: 2015-05-19 | Discharge: 2015-05-19 | Disposition: A | Discharge status: Home / Self Care | Payer: PPO | Attending: Emergency Medicine | Admitting: Emergency Medicine

## 2015-05-19 ENCOUNTER — Encounter (HOSPITAL_COMMUNITY): Payer: Self-pay | Admitting: Emergency Medicine

## 2015-05-19 DIAGNOSIS — Z720 Tobacco use: Secondary | ICD-10-CM | POA: Diagnosis not present

## 2015-05-19 DIAGNOSIS — M79604 Pain in right leg: Secondary | ICD-10-CM | POA: Diagnosis present

## 2015-05-19 DIAGNOSIS — I739 Peripheral vascular disease, unspecified: Secondary | ICD-10-CM | POA: Diagnosis not present

## 2015-05-19 DIAGNOSIS — K429 Umbilical hernia without obstruction or gangrene: Secondary | ICD-10-CM | POA: Diagnosis not present

## 2015-05-19 DIAGNOSIS — Z8659 Personal history of other mental and behavioral disorders: Secondary | ICD-10-CM | POA: Diagnosis not present

## 2015-05-19 DIAGNOSIS — Z79899 Other long term (current) drug therapy: Secondary | ICD-10-CM | POA: Insufficient documentation

## 2015-05-19 DIAGNOSIS — Z7982 Long term (current) use of aspirin: Secondary | ICD-10-CM | POA: Diagnosis not present

## 2015-05-19 DIAGNOSIS — Z862 Personal history of diseases of the blood and blood-forming organs and certain disorders involving the immune mechanism: Secondary | ICD-10-CM | POA: Diagnosis not present

## 2015-05-19 MED ORDER — OXYCODONE-ACETAMINOPHEN 5-325 MG PO TABS
2.0000 | ORAL_TABLET | Freq: Once | ORAL | Status: AC
  Administered 2015-05-19: 2 via ORAL
  Filled 2015-05-19: qty 2

## 2015-05-19 MED ORDER — HYDROCODONE-ACETAMINOPHEN 5-325 MG PO TABS: 2.0000 | ORAL_TABLET | ORAL | Status: DC | PRN

## 2015-05-19 NOTE — Discharge Instructions (Signed)
Peripheral Vascular Disease Follow up with vascular surgery, Dr. Scot Dock. Take tylenol for pain and continue taking aspirin. Take hydrocodone for breakthrough pain only. Return for numbness or pallor to the leg.  Peripheral Vascular Disease (PVD), also called Peripheral Arterial Disease (PAD), is a circulation problem caused by cholesterol (atherosclerotic plaque) deposits in the arteries. PVD commonly occurs in the lower extremities (legs) but it can occur in other areas of the body, such as your arms. The cholesterol buildup in the arteries reduces blood flow which can cause pain and other serious problems. The presence of PVD can place a person at risk for Coronary Artery Disease (CAD).  CAUSES  Causes of PVD can be many. It is usually associated with more than one risk factor such as:   High Cholesterol.  Smoking.  Diabetes.  Lack of exercise or inactivity.  High blood pressure (hypertension).  Obesity.  Family history. SYMPTOMS   When the lower extremities are affected, patients with PVD may experience:  Leg pain with exertion or physical activity. This is called INTERMITTENT CLAUDICATION. This may present as cramping or numbness with physical activity. The location of the pain is associated with the level of blockage. For example, blockage at the abdominal level (distal abdominal aorta) may result in buttock or hip pain. Lower leg arterial blockage may result in calf pain.  As PVD becomes more severe, pain can develop with less physical activity.  In people with severe PVD, leg pain may occur at rest.  Other PVD signs and symptoms:  Leg numbness or weakness.  Coldness in the affected leg or foot, especially when compared to the other leg.  A change in leg color.  Patients with significant PVD are more prone to ulcers or sores on toes, feet or legs. These may take longer to heal or may reoccur. The ulcers or sores can become infected.  If signs and symptoms of PVD are  ignored, gangrene may occur. This can result in the loss of toes or loss of an entire limb.  Not all leg pain is related to PVD. Other medical conditions can cause leg pain such as:  Blood clots (embolism) or Deep Vein Thrombosis.  Inflammation of the blood vessels (vasculitis).  Spinal stenosis. DIAGNOSIS  Diagnosis of PVD can involve several different types of tests. These can include:  Pulse Volume Recording Method (PVR). This test is simple, painless and does not involve the use of X-rays. PVR involves measuring and comparing the blood pressure in the arms and legs. An ABI (Ankle-Brachial Index) is calculated. The normal ratio of blood pressures is 1. As this number becomes smaller, it indicates more severe disease.  < 0.95 - indicates significant narrowing in one or more leg vessels.  <0.8 - there will usually be pain in the foot, leg or buttock with exercise.  <0.4 - will usually have pain in the legs at rest.  <0.25 - usually indicates limb threatening PVD.  Doppler detection of pulses in the legs. This test is painless and checks to see if you have a pulses in your legs/feet.  A dye or contrast material (a substance that highlights the blood vessels so they show up on x-ray) may be given to help your caregiver better see the arteries for the following tests. The dye is eliminated from your body by the kidney's. Your caregiver may order blood work to check your kidney function and other laboratory values before the following tests are performed:  Magnetic Resonance Angiography (MRA). An MRA is  a picture study of the blood vessels and arteries. The MRA machine uses a large magnet to produce images of the blood vessels.  Computed Tomography Angiography (CTA). A CTA is a specialized x-ray that looks at how the blood flows in your blood vessels. An IV may be inserted into your arm so contrast dye can be injected.  Angiogram. Is a procedure that uses x-rays to look at your blood  vessels. This procedure is minimally invasive, meaning a small incision (cut) is made in your groin. A small tube (catheter) is then inserted into the artery of your groin. The catheter is guided to the blood vessel or artery your caregiver wants to examine. Contrast dye is injected into the catheter. X-rays are then taken of the blood vessel or artery. After the images are obtained, the catheter is taken out. TREATMENT  Treatment of PVD involves many interventions which may include:  Lifestyle changes:  Quitting smoking.  Exercise.  Following a low fat, low cholesterol diet.  Control of diabetes.  Foot care is very important to the PVD patient. Good foot care can help prevent infection.  Medication:  Cholesterol-lowering medicine.  Blood pressure medicine.  Anti-platelet drugs.  Certain medicines may reduce symptoms of Intermittent Claudication.  Interventional/Surgical options:  Angioplasty. An Angioplasty is a procedure that inflates a balloon in the blocked artery. This opens the blocked artery to improve blood flow.  Stent Implant. A wire mesh tube (stent) is placed in the artery. The stent expands and stays in place, allowing the artery to remain open.  Peripheral Bypass Surgery. This is a surgical procedure that reroutes the blood around a blocked artery to help improve blood flow. This type of procedure may be performed if Angioplasty or stent implants are not an option. SEEK IMMEDIATE MEDICAL CARE IF:   You develop pain or numbness in your arms or legs.  Your arm or leg turns cold, becomes blue in color.  You develop redness, warmth, swelling and pain in your arms or legs. MAKE SURE YOU:   Understand these instructions.  Will watch your condition.  Will get help right away if you are not doing well or get worse. Document Released: 11/21/2004 Document Revised: 01/06/2012 Document Reviewed: 10/18/2008 [facility] Patient Information 2015 [place], [place]. This  information is not intended to replace advice given to you by your health care provider. Make sure you discuss any questions you have with your health care provider.

## 2015-05-19 NOTE — ED Provider Notes (Signed)
CSN: [id number]     Arrival date & time 05/19/15  1110 History   First MD Initiated Contact with Patient 05/19/15 1128     Chief Complaint  Patient presents with  . Leg Pain     (Consider location/radiation/quality/duration/timing/severity/associated sxs/prior Treatment) Patient is a 69 y.o. female presenting with leg pain. The history is provided by the patient. No language interpreter was used.  Leg Pain Associated symptoms: no fever    Anna Barrera is a  69 year old female with a history of a perforated bowel , PVD , anemia, constipation , femoral-tibial artery bypass graft who presents with right buttocks and thigh pain that began 4 days ago and has gradually worsened. Pain is worse with standing and relieved by sitting. She states she was referred to a pain specialist 2 weeks ago by her primary care physician but they never called her for an appointment. Patient states she leads sedentary lifestyle. She takes 324mg  of aspirin daily. She denies taking anything for pain. Patient is an every day smoker. Past Medical History  Diagnosis Date  . Seasonal allergies   . Perforated bowel   . Complication of anesthesia     "hard time waking up"  . Peripheral vascular disease   . Depression   . Hay fever   . Constipation   . Anemia    Past Surgical History  Procedure Laterality Date  . Hernia repair    . Colostomy    . Colostomy reversal     . Femoral-tibial bypass graft  06/16/2012    Procedure: BYPASS GRAFT FEMORAL-TIBIAL ARTERY;  Surgeon: Anna Mould, MD;  Location: [facility];  Service: Vascular;  Laterality: Right;  . Intraoperative arteriogram  06/16/2012    Procedure: INTRA OPERATIVE ARTERIOGRAM;  Surgeon: Anna Mould, MD;  Location: [facility];  Service: Vascular;  Laterality: Right;  . Lower extremity angiogram Left 06/10/2012    Procedure: LOWER EXTREMITY ANGIOGRAM;  Surgeon: Serafina Mitchell, MD;  Location: [facility] C[facility] LAB;  Service: Cardiovascular;  Laterality: Left;    Family History  Problem Relation Age of Onset  . Breast cancer Mother    History  Substance Use Topics  . Smoking status: Current Every Day Smoker -- 0.10 packs/day  . Smokeless tobacco: Not on file  . Alcohol Use: No   OB History    No data available     Review of Systems  Constitutional: Negative for fever.  Respiratory: Negative for shortness of breath.   Cardiovascular: Negative for chest pain and leg swelling.  Musculoskeletal: Positive for myalgias.  All other systems reviewed and are negative.     Allergies  Review of patient's allergies indicates no known allergies.  Home Medications   Prior to Admission medications   Medication Sig Start Date End Date Taking? Authorizing Provider  aspirin 325 MG EC tablet Take 325 mg by mouth every 4 (four) hours as needed for pain.    Yes Historical Provider, MD  ibuprofen (ADVIL,MOTRIN) 200 MG tablet Take 200-400 mg by mouth every 12 (twelve) hours as needed for headache, mild pain or moderate pain.   Yes Historical Provider, MD  Multiple Vitamins-Minerals (MULTIVITAMIN ADULTS 50+ PO) Take 1 tablet by mouth daily.   Yes Historical Provider, MD  simvastatin (ZOCOR) 20 MG tablet Take 1 tablet (20 mg total) by mouth daily at 6 PM. 06/12/12 06/12/13  Oswald Hillock, MD   BP 145/84 mmHg  Pulse 93  Temp(Src) 99.2 F (37.3 C) (Oral)  Resp 18  SpO2  100% Physical Exam  Constitutional: She is oriented to person, place, and time. She appears well-developed and well-nourished.  HENT:  Head: Normocephalic and atraumatic.  Eyes: Conjunctivae are normal.  Neck: Normal range of motion. Neck supple.  Cardiovascular: Normal rate, regular rhythm and normal heart sounds.   Pulmonary/Chest: Effort normal and breath sounds normal.  Abdominal: Soft. Ascites:  large pperiumbilical hernia. There is no tenderness.  No signs of hernia strangulation.  No tenderness to palpation of the abdomen.   Musculoskeletal: Normal range of motion.  5/5  strength in bilateral lower extremities. Good dorsi and plantar flexion in the right leg. Capillary refill less than 2 seconds. Good DP pulses bilaterally. No pallor to the foot or leg. No leg swelling or rash. NVI.  Neurological: She is alert and oriented to person, place, and time.  Skin: Skin is warm and dry.  Psychiatric: She has a normal mood and affect. Her behavior is normal.  Nursing note and vitals reviewed.   ED Course  Procedures (including critical care time) Labs Review Labs Reviewed - No data to display  Imaging Review No results found.   EKG Interpretation None      MDM   Final diagnoses:  Peripheral vascular disease  Patient presents for right buttocks and thigh pain. 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 He referred her to vascular surgery. There are no concerning signs or symptoms on exam. She is well-appearing and in no acute distress. She has no complaints such as chest pain or shortness of breath, numbness or tingling , weakness. She is ambulatory. This pain is most likely due to her PVD or possibly musculoskeletal related since she is tender to palpation along the are of pain from buttox to lateral right thigh. She is ambulatory with steady gait. Medications  oxyCODONE-acetaminophen (PERCOCET/ROXICET) 5-325 MG per tablet 2 tablet (2 tablets Oral Given 05/19/15 1148)  I discussed the importance of following up with vascular surgery since she had a bypass graft in the right leg 3 years ago. I explained that she could take tylenol for pain and I gave her hydrocodone for breakthrough pain. I referred her to Dr. Scot Dock who did per previous surgery. Patient verbally agrees with the plan.    Ottie Glazier, PA-C 05/19/15 [place], MD 05/20/15 [phone]

## 2015-05-19 NOTE — ED Notes (Signed)
Patient was educated not to drive, operate heavy machinery, or drink alcohol while taking narcotic medication.  

## 2015-05-19 NOTE — ED Notes (Signed)
Per EMS-states right leg pain from hip all the way down-had skin graft on that leg 3 years ago-

## 2015-05-19 NOTE — ED Notes (Signed)
Bed: [facility] Expected date:  Expected time:  Means of arrival:  Comments: EMS- 68yo F, leg pain x 4 days, no injury

## 2015-06-16 ENCOUNTER — Encounter: Payer: Self-pay | Admitting: Vascular Surgery

## 2015-06-16 ENCOUNTER — Other Ambulatory Visit: Payer: Self-pay | Admitting: *Deleted

## 2015-06-16 DIAGNOSIS — I724 Aneurysm of artery of lower extremity: Secondary | ICD-10-CM

## 2015-06-16 DIAGNOSIS — Z48812 Encounter for surgical aftercare following surgery on the circulatory system: Secondary | ICD-10-CM

## 2015-06-16 DIAGNOSIS — I739 Peripheral vascular disease, unspecified: Secondary | ICD-10-CM

## 2015-06-20 ENCOUNTER — Encounter: Payer: Self-pay | Admitting: Vascular Surgery

## 2015-06-21 ENCOUNTER — Encounter: Payer: Self-pay | Admitting: Vascular Surgery

## 2015-06-21 ENCOUNTER — Ambulatory Visit (INDEPENDENT_AMBULATORY_CARE_PROVIDER_SITE_OTHER): Payer: PPO | Admitting: Vascular Surgery

## 2015-06-21 ENCOUNTER — Ambulatory Visit (INDEPENDENT_AMBULATORY_CARE_PROVIDER_SITE_OTHER)
Admission: RE | Admit: 2015-06-21 | Discharge: 2015-06-21 | Disposition: A | Discharge status: Home / Self Care | Payer: PPO | Source: Ambulatory Visit | Attending: Vascular Surgery | Admitting: Vascular Surgery

## 2015-06-21 ENCOUNTER — Ambulatory Visit (HOSPITAL_COMMUNITY)
Admission: RE | Admit: 2015-06-21 | Discharge: 2015-06-21 | Disposition: A | Discharge status: Home / Self Care | Payer: PPO | Source: Ambulatory Visit | Attending: Vascular Surgery | Admitting: Vascular Surgery

## 2015-06-21 ENCOUNTER — Encounter (INDEPENDENT_AMBULATORY_CARE_PROVIDER_SITE_OTHER): Payer: Self-pay

## 2015-06-21 VITALS — BP 141/85 | HR 81 | Temp 98.4°F | Resp 18 | Ht 62.0 in | Wt 136.0 lb

## 2015-06-21 DIAGNOSIS — Z48812 Encounter for surgical aftercare following surgery on the circulatory system: Secondary | ICD-10-CM

## 2015-06-21 DIAGNOSIS — I724 Aneurysm of artery of lower extremity: Secondary | ICD-10-CM | POA: Insufficient documentation

## 2015-06-21 DIAGNOSIS — I739 Peripheral vascular disease, unspecified: Secondary | ICD-10-CM | POA: Diagnosis not present

## 2015-06-21 DIAGNOSIS — Z95828 Presence of other vascular implants and grafts: Secondary | ICD-10-CM | POA: Insufficient documentation

## 2015-06-21 NOTE — Progress Notes (Signed)
Filed Vitals:   06/21/15 1051 06/21/15 1101  BP: 165/116 141/85  Pulse: 90 81  Temp: 98.4 F (36.9 C)   TempSrc: Oral   Resp: 18   Height: 5\' 2"  (1.575 m)   Weight: 136 lb (61.689 kg)   SpO2: 97%

## 2015-06-21 NOTE — Progress Notes (Signed)
Patient ID: Anna Barrera, female   DOB: 06-19-1946, 69 y.o.   MRN: [id number]   [facility]  Patient name: Anna Barrera MRN: [id number] DOB: 1946/03/27 Sex: female  REASON FOR VISIT: Pseudoaneurysm of right common femoral artery  HPI: Anna Barrera is a 69 y.o. female who underwent a right femoral to below knee popliteal artery bypass with a vein graft on 06/16/2012. This was for ischemic toes of the right foot and infrainguinal arterial occlusive disease.  In July she developed some right thigh pain which she noted gradually. She was seen in the [facility] the emergency department. A CT scan showed a large midline supraumbilical ventral hernia. She has undergone prior mesh repair. She's had previous sigmoid colectomy and cholecystectomy. An incidental finding was a 2.3 cm x 1.7 cm pseudoaneurysm of the right common femoral artery near the origin of her bypass graft. Therefore vascular surgery consult was recommended.  The patient states that the pain in her right thigh has essentially resolved. She denies significant right lower extremity swelling or claudication. Currently she is not having any significant groin pain.  She tells me that she was told told that she was too high-risk for repair of her ventral hernia and she is concerned about having any surgery.  She smokes 1 pack per day of cigarettes. She denies any chest pain or chest pressure. She does admit to orthopnea and dyspnea on exertion. She is currently not on home oxygen.   Past Medical History  Diagnosis Date  . Seasonal allergies   . Perforated bowel   . Complication of anesthesia     "hard time waking up"  . Peripheral vascular disease   . Depression   . Hay fever   . Constipation   . Anemia   . COPD (chronic obstructive pulmonary disease)   . Cancer     BCC -  Left upper chest   Family History  Problem Relation Age of Onset  . Breast cancer Mother   . Cancer Mother     . Cancer Father   . Diabetes Brother    SOCIAL HISTORY: Social History  Substance Use Topics  . Smoking status: Current Every Day Smoker -- 0.10 packs/day  . Smokeless tobacco: Never Used  . Alcohol Use: No   No Known Allergies Current Outpatient Prescriptions  Medication Sig Dispense Refill  . aspirin 325 MG EC tablet Take 325 mg by mouth every 4 (four) hours as needed for pain.     . Multiple Vitamins-Minerals (MULTIVITAMIN ADULTS 50+ PO) Take 1 tablet by mouth daily.    Marland Kitchen HYDROcodone-acetaminophen (NORCO/VICODIN) 5-325 MG per tablet Take 2 tablets by mouth every 4 (four) hours as needed. (Patient not taking: Reported on 06/21/2015) 6 tablet 0  . ibuprofen (ADVIL,MOTRIN) 200 MG tablet Take 200-400 mg by mouth every 12 (twelve) hours as needed for headache, mild pain or moderate pain.    . simvastatin (ZOCOR) 20 MG tablet Take 1 tablet (20 mg total) by mouth daily at 6 PM. 30 tablet 1   No current facility-administered medications for this visit.   REVIEW OF SYSTEMS: Valu.Nieves ] denotes positive finding; [  ] denotes negative finding  CARDIOVASCULAR:  [ ]  chest pain   [ ]  chest pressure   [ ]  palpitations   Valu.Nieves ] orthopnea   Valu.Nieves ] dyspnea on exertion   [ ]  claudication   [ ]  rest pain   [ ]  DVT   [ ]   phlebitis PULMONARY:   Valu.Nieves ] productive cough   [ ]  asthma   Valu.Nieves ] wheezing NEUROLOGIC:   [ ]  weakness  [ ]  paresthesias  [ ]  aphasia  [ ]  amaurosis  Valu.Nieves ] dizziness HEMATOLOGIC:   [ ]  bleeding problems   [ ]  clotting disorders MUSCULOSKELETAL:  [ ]  joint pain   [ ]  joint swelling Valu.Nieves ] leg swelling GASTROINTESTINAL: [ ]   blood in stool  [ ]   hematemesis GENITOURINARY:  [ ]   dysuria  [ ]   hematuria PSYCHIATRIC:  [ ]  history of major depression INTEGUMENTARY:  [ ]  rashes  [ ]  ulcers CONSTITUTIONAL:  [ ]  fever   [ ]  chills  PHYSICAL EXAM: Filed Vitals:   06/21/15 1051 06/21/15 1101  BP: 165/116 141/85  Pulse: 90 81  Temp: 98.4 F (36.9 C)   TempSrc: Oral   Resp: 18   Height: 5\' 2"  (1.575 m)    Weight: 136 lb (61.689 kg)   SpO2: 97%    GENERAL: The patient is a well-nourished female, in no acute distress. The vital signs are documented above. CARDIAC: There is a regular rate and rhythm.  VASCULAR: I do not detect carotid bruits. On the right side, she has a palpable femoral and popliteal pulse. I cannot palpate pedal pulses over the right foot is warm and well-perfused. On the left side, she has a palpable femoral pulse. I cannot palpate pedal pulses on the left. PULMONARY: There is good air exchange bilaterally without wheezing or rales. ABDOMEN: Soft and non-tender with normal pitched bowel sounds. She has a large hernia. MUSCULOSKELETAL: There are no major deformities or cyanosis. NEUROLOGIC: No focal weakness or paresthesias are detected. SKIN: There are no ulcers or rashes noted. PSYCHIATRIC: The patient has a normal affect.  DATA:  I have independently interpreted her lower extremity arterial duplex today which shows triphasic Doppler signals throughout her bypass graft on the right. There is some aneurysmal dilatation of the right common femoral artery and proximal anastomosis which measures 2.2 cm in maximum diameter. No areas of significant stenosis are noted within the graft.  I have independently interpreted her arterial Doppler study which shows an ABI of 100% on the right and 58% on the left. She has triphasic Doppler signals in the right foot in the posterior tibial and dorsalis pedis positions. She has biphasic Doppler signals in the left foot and the dorsalis pedis and posterior tibial positions.  CT SCAN: CT shows the pseudoaneurysm involving her common femoral artery and proximal bypass graft.  MEDICAL ISSUES:  PSEUDOANEURYSM OF THE RIGHT COMMON FEMORAL ARTERY ADJACENT TO HER BYPASS GRAFT: Given the pseudoaneurysm in the right groin I would recommend elective repair of this. This could potentially require taking vein from the left leg. However, she is concerned  that she is at high-risk for surgery because of her lungs. For this reason we will get medical clearance from her primary care doctor and if necessary have her undergo pulmonary evaluation. Once she is cleared we'll schedule her for repair of the right femoral pseudoaneurysm which could potentially require taking vein from the left leg to revise the proximal aspect of her right femoral to below-knee popliteal bypass graft. Have discussed the procedure and potential, occasions in the office today. If she is cleared we could tentatively schedule her for surgery on September 13.  HYPERTENSION: The patient's initial blood pressure today was elevated. We repeated this and this was still elevated. We have encouraged the patient to follow  up with their primary care physician for management of their blood pressure.  Deitra Mayo [facility] [place]: [phone]

## 2015-06-22 ENCOUNTER — Other Ambulatory Visit: Payer: Self-pay

## 2015-07-10 ENCOUNTER — Encounter: Payer: Self-pay | Admitting: Nurse Practitioner

## 2015-07-18 ENCOUNTER — Encounter (HOSPITAL_COMMUNITY)
Admission: RE | Admit: 2015-07-18 | Discharge: 2015-07-18 | Disposition: A | Discharge status: Home / Self Care | Payer: PPO | Source: Ambulatory Visit | Attending: Vascular Surgery | Admitting: Vascular Surgery

## 2015-07-18 LAB — URINE MICROSCOPIC-ADD ON

## 2015-07-18 LAB — COMPREHENSIVE METABOLIC PANEL
ALBUMIN: 4 g/dL (ref 3.5–5.0)
ALT: 24 U/L (ref 14–54)
AST: 22 U/L (ref 15–41)
Alkaline Phosphatase: 86 U/L (ref 38–126)
Anion gap: 11 (ref 5–15)
BUN: 11 mg/dL (ref 6–20)
CHLORIDE: 101 mmol/L (ref 101–111)
CO2: 28 mmol/L (ref 22–32)
CREATININE: 0.88 mg/dL (ref 0.44–1.00)
Calcium: 9.9 mg/dL (ref 8.9–10.3)
GFR calc non Af Amer: >60 mL/min (ref >60)
GLUCOSE: 105 mg/dL — AB (ref 65–99)
Potassium: 3.6 mmol/L (ref 3.5–5.1)
SODIUM: 140 mmol/L (ref 135–145)
Total Bilirubin: 0.5 mg/dL (ref 0.3–1.2)
Total Protein: 7.2 g/dL (ref 6.5–8.1)

## 2015-07-18 LAB — CBC
HCT: 42 % (ref 36.0–46.0)
Hemoglobin: 14.6 g/dL (ref 12.0–15.0)
MCH: 32 pg (ref 26.0–34.0)
MCHC: 34.8 g/dL (ref 30.0–36.0)
MCV: 92.1 fL (ref 78.0–100.0)
PLATELETS: 198 10*3/uL (ref 150–400)
RBC: 4.56 MIL/uL (ref 3.87–5.11)
RDW: 12.6 % (ref 11.5–15.5)
WBC: 10.9 10*3/uL — AB (ref 4.0–10.5)

## 2015-07-18 LAB — URINALYSIS, ROUTINE W REFLEX MICROSCOPIC
Bilirubin Urine: NEGATIVE
GLUCOSE, UA: NEGATIVE mg/dL
KETONES UR: NEGATIVE mg/dL
LEUKOCYTES UA: NEGATIVE
Nitrite: NEGATIVE
PROTEIN: NEGATIVE mg/dL
Specific Gravity, Urine: 1.004 — ABNORMAL LOW (ref 1.005–1.030)
Urobilinogen, UA: 0.2 mg/dL (ref 0.0–1.0)
pH: 6.5 (ref 5.0–8.0)

## 2015-07-18 LAB — TYPE AND SCREEN
ABO/RH(D): A POS
Antibody Screen: NEGATIVE

## 2015-07-18 LAB — APTT: APTT: 26 s (ref 24–37)

## 2015-07-18 LAB — GLUCOSE, CAPILLARY: Glucose-Capillary: 110 mg/dL — ABNORMAL HIGH (ref 65–99)

## 2015-07-18 LAB — PROTIME-INR
INR: 1.08 (ref 0.00–1.49)
Prothrombin Time: 14.2 seconds (ref 11.6–15.2)

## 2015-07-18 LAB — SURGICAL PCR SCREEN
MRSA, PCR: NEGATIVE
Staphylococcus aureus: NEGATIVE

## 2015-07-18 NOTE — Pre-Procedure Instructions (Addendum)
    REYNE FALCONI  07/18/2015      [facility] - [address], [place] [address] Phone: [phone] Fax: [phone]    Your procedure is scheduled on July 20, 2015.  Report to [facility] Admitting at 10 A.M.  Call this number if you have problems the morning of surgery:  [phone]   Remember:  Do not eat food or drink liquids after midnight.  Take these medicines the morning of surgery with A SIP OF WATER : none    STOP ASPIRIN, NSAIDS (ADVIL, ALEVE, IBUPROFEN), HERBAL MEDICATIONS AS OF TODAY    Do not wear jewelry, make-up or nail polish.  Do not wear lotions, powders, or perfumes.  You may NOT wear deodorant.  Do not shave 48 hours prior to surgery.    Do not bring valuables to the hospital.  [facility] is not responsible for any belongings or valuables.  Contacts, dentures or bridgework may not be worn into surgery.  Leave your suitcase in the car.  After surgery it may be brought to your room.  For patients admitted to the hospital, discharge time will be determined by your treatment team.  Patients discharged the day of surgery will not be allowed to drive home.   Name and phone number of your driver:    Special instructions:  "PREPARING FOR SURGERY"  Please read over the following fact sheets that you were given. Pain Booklet, Coughing and Deep Breathing and Surgical Site Infection Prevention      How to Manage Your Diabetes Before Surgery   Why is it important to control my blood sugar before and after surgery?   Improving blood sugar levels before and after surgery helps healing and can limit problems.  A way of improving blood sugar control is eating a healthy diet by:  - Eating less sugar and carbohydrates  - Increasing activity/exercise  - Talk with your doctor about reaching your blood sugar goals  High blood sugars (greater than 180 mg/dL) can raise your risk of  infections and slow down your recovery so you will need to focus on controlling your diabetes during the weeks before surgery.  Make sure that the doctor who takes care of your diabetes knows about your planned surgery including the date and location.  How do I manage my blood sugars before surgery?   Check your blood sugar at least 4 times a day, 2 days before surgery to make sure that they are not too high or low.   Check your blood sugar the morning of your surgery when you wake up and every 2               hours until you get to the Short-Stay unit.  If your blood sugar is less than 70 mg/dL, you will need to treat for low blood sugar by:  Treat a low blood sugar (less than 70 mg/dL) with 1/2 cup of clear juice (cranberry or apple), 4 glucose tablets, OR glucose gel.  Recheck blood sugar in 15 minutes after treatment (to make sure it is greater than 70 mg/dL).  If blood sugar is not greater than 70 mg/dL on re-check, call [phone] for further instructions.   Report your blood sugar to the Short-Stay nurse when you get to Short-Stay.  References:  [facility], 2007 "How to Manage your Diabetes Before and After Surgery".

## 2015-07-19 NOTE — Progress Notes (Signed)
EKG faxed over- chart given to [facility].

## 2015-07-19 NOTE — Progress Notes (Signed)
Dr.Lam's office called to follow up on records requested, spoke with Jacqlyn Larsen and Caryl Pina. State she will fax it over.

## 2015-07-19 NOTE — Progress Notes (Signed)
Anesthesia Chart Review: Patient is a 69 year old female scheduled for repair of right femoral pseudoaneurysm tomorrow by Dr. Scot Dock.  History includes smoking, PVD s/p right FTBG '13, anemia, left breast cancer, COPD, depression, perforated bowel s/p resection with colostomy with reversal. Reports a "hard time waking up" after anesthesia.  PCP is listed as Anna Holler, NP with [facility]. Following her 07/06/15 visit, she wrote, "I am clearing [name] for the surgery with the understanding that she is a current smoker with Pulmonary Function Test performed this week showing moderate obstructive pulmonary disease."  She is not routinely followed by cardiology, but was seen by [facility] in 2013 for a preoperative evaluation prior to undergoing FTBG.   07/06/15 EKG (PCP): SR, possible septal infarct (age undetermined). Overall, I think her EKG is stable when compared to pre-stress test EKG done 06/11/12 (in [place]).  06/11/12 Nuclear stress test: IMPRESSION: Reduced inferior wall activity on stress rest imaging, probably from diaphragmatic attenuation and less likely due to scar. No inducible ischemia observed. LVEF 54%.   06/11/12 Echo:  Study Conclusions - Left ventricle: The cavity size was normal. Wall thickness was increased in a pattern of mild LVH. The estimated ejection fraction was 60%. Wall motion was normal; there were no regional wall motion abnormalities. - Tricuspid valve: Mild regurgitation. - Pulmonary arteries: PA peak pressure: 39mm Hg (S). - Impressions: There are no significant valvular abnormalities. Impressions:     - There are no significant valvular abnormalities.  06/10/12 Carotid duplex: Summary: No significant extracranial carotid artery stenosis demonstrated. Vertebrals are patent with antegrade flow.  By PCP notes, PFTs on ~ 07/05/15 [facility]) showed moderate COPD, FEV1/FVC 67%.   Preoperative  labs noted.   If no acute changes then I anticipate that she can proceed as planned.  George Hugh [facility] Short Stay Center/Anesthesiology Phone [phone] 07/19/2015 3:32 PM

## 2015-07-20 ENCOUNTER — Inpatient Hospital Stay (HOSPITAL_COMMUNITY)
Admission: RE | Admit: 2015-07-20 | Discharge: 2015-07-22 | DRG: 254 | Disposition: A | Discharge status: Home / Self Care | Payer: PPO | Source: Ambulatory Visit | Attending: Vascular Surgery | Admitting: Vascular Surgery

## 2015-07-20 ENCOUNTER — Encounter (HOSPITAL_COMMUNITY)
Admission: RE | Disposition: A | Discharge status: Home / Self Care | Payer: Self-pay | Source: Ambulatory Visit | Attending: Vascular Surgery

## 2015-07-20 ENCOUNTER — Inpatient Hospital Stay (HOSPITAL_COMMUNITY): Payer: PPO | Admitting: Anesthesiology

## 2015-07-20 ENCOUNTER — Inpatient Hospital Stay (HOSPITAL_COMMUNITY): Payer: PPO | Admitting: Vascular Surgery

## 2015-07-20 ENCOUNTER — Encounter (HOSPITAL_COMMUNITY): Payer: Self-pay | Admitting: General Practice

## 2015-07-20 DIAGNOSIS — Z85828 Personal history of other malignant neoplasm of skin: Secondary | ICD-10-CM | POA: Diagnosis not present

## 2015-07-20 DIAGNOSIS — I10 Essential (primary) hypertension: Secondary | ICD-10-CM | POA: Diagnosis present

## 2015-07-20 DIAGNOSIS — F329 Major depressive disorder, single episode, unspecified: Secondary | ICD-10-CM | POA: Diagnosis present

## 2015-07-20 DIAGNOSIS — K439 Ventral hernia without obstruction or gangrene: Secondary | ICD-10-CM | POA: Diagnosis not present

## 2015-07-20 DIAGNOSIS — J449 Chronic obstructive pulmonary disease, unspecified: Secondary | ICD-10-CM | POA: Diagnosis present

## 2015-07-20 DIAGNOSIS — Z9889 Other specified postprocedural states: Secondary | ICD-10-CM

## 2015-07-20 DIAGNOSIS — T82868A Thrombosis of vascular prosthetic devices, implants and grafts, initial encounter: Secondary | ICD-10-CM | POA: Diagnosis present

## 2015-07-20 DIAGNOSIS — I724 Aneurysm of artery of lower extremity: Secondary | ICD-10-CM | POA: Diagnosis present

## 2015-07-20 DIAGNOSIS — Z9049 Acquired absence of other specified parts of digestive tract: Secondary | ICD-10-CM | POA: Diagnosis not present

## 2015-07-20 DIAGNOSIS — F17210 Nicotine dependence, cigarettes, uncomplicated: Secondary | ICD-10-CM | POA: Diagnosis not present

## 2015-07-20 DIAGNOSIS — D649 Anemia, unspecified: Secondary | ICD-10-CM | POA: Diagnosis not present

## 2015-07-20 DIAGNOSIS — Y828 Other medical devices associated with adverse incidents: Secondary | ICD-10-CM | POA: Diagnosis not present

## 2015-07-20 DIAGNOSIS — J302 Other seasonal allergic rhinitis: Secondary | ICD-10-CM | POA: Diagnosis present

## 2015-07-20 DIAGNOSIS — I739 Peripheral vascular disease, unspecified: Secondary | ICD-10-CM | POA: Diagnosis present

## 2015-07-20 DIAGNOSIS — Z7982 Long term (current) use of aspirin: Secondary | ICD-10-CM | POA: Diagnosis not present

## 2015-07-20 DIAGNOSIS — Z79899 Other long term (current) drug therapy: Secondary | ICD-10-CM | POA: Diagnosis not present

## 2015-07-20 DIAGNOSIS — Z23 Encounter for immunization: Secondary | ICD-10-CM | POA: Diagnosis not present

## 2015-07-20 DIAGNOSIS — T82898A Other specified complication of vascular prosthetic devices, implants and grafts, initial encounter: Secondary | ICD-10-CM

## 2015-07-20 HISTORY — PX: FALSE ANEURYSM REPAIR: SHX5152

## 2015-07-20 HISTORY — DX: Incisional hernia without obstruction or gangrene: K43.2

## 2015-07-20 LAB — CBC
HEMATOCRIT: 37.1 % (ref 36.0–46.0)
HEMOGLOBIN: 12.5 g/dL (ref 12.0–15.0)
MCH: 31.5 pg (ref 26.0–34.0)
MCHC: 33.7 g/dL (ref 30.0–36.0)
MCV: 93.5 fL (ref 78.0–100.0)
Platelets: 158 10*3/uL (ref 150–400)
RBC: 3.97 MIL/uL (ref 3.87–5.11)
RDW: 12.7 % (ref 11.5–15.5)
WBC: 13.6 10*3/uL — ABNORMAL HIGH (ref 4.0–10.5)

## 2015-07-20 LAB — CREATININE, SERUM
CREATININE: 0.8 mg/dL (ref 0.44–1.00)
GFR calc Af Amer: >60 mL/min (ref >60)
GFR calc non Af Amer: >60 mL/min (ref >60)

## 2015-07-20 SURGERY — REPAIR, PSEUDOANEURYSM
Anesthesia: General | Site: Groin | Laterality: Right

## 2015-07-20 MED ORDER — HYDROMORPHONE HCL 1 MG/ML IJ SOLN
0.2500 mg | INTRAMUSCULAR | Status: DC | PRN
  Administered 2015-07-20 (×2): 0.5 mg via INTRAVENOUS

## 2015-07-20 MED ORDER — HYDRALAZINE HCL 20 MG/ML IJ SOLN: 5.0000 mg | INTRAMUSCULAR | Status: DC | PRN

## 2015-07-20 MED ORDER — SUGAMMADEX SODIUM 200 MG/2ML IV SOLN
INTRAVENOUS | Status: DC | PRN
  Administered 2015-07-20: 200 mg via INTRAVENOUS

## 2015-07-20 MED ORDER — CEFUROXIME SODIUM 1.5 G IJ SOLR
1.5000 g | Freq: Two times a day (BID) | INTRAMUSCULAR | Status: AC
  Administered 2015-07-20 – 2015-07-21 (×2): 1.5 g via INTRAVENOUS
  Filled 2015-07-20 (×2): qty 1.5

## 2015-07-20 MED ORDER — MIDAZOLAM HCL 5 MG/5ML IJ SOLN
INTRAMUSCULAR | Status: DC | PRN
  Administered 2015-07-20 (×2): 1 mg via INTRAVENOUS

## 2015-07-20 MED ORDER — PHENOL 1.4 % MT LIQD: 1.0000 | OROMUCOSAL | Status: DC | PRN

## 2015-07-20 MED ORDER — THROMBIN 20000 UNITS EX SOLR
CUTANEOUS | Status: AC
  Filled 2015-07-20: qty 20000

## 2015-07-20 MED ORDER — PNEUMOCOCCAL VAC POLYVALENT 25 MCG/0.5ML IJ INJ
0.5000 mL | INJECTION | INTRAMUSCULAR | Status: AC
  Administered 2015-07-21: 0.5 mL via INTRAMUSCULAR
  Filled 2015-07-20: qty 0.5

## 2015-07-20 MED ORDER — PROPOFOL 10 MG/ML IV BOLUS
INTRAVENOUS | Status: DC | PRN
  Administered 2015-07-20: 160 mg via INTRAVENOUS

## 2015-07-20 MED ORDER — GUAIFENESIN-DM 100-10 MG/5ML PO SYRP
15.0000 mL | ORAL_SOLUTION | ORAL | Status: DC | PRN
  Administered 2015-07-21: 15 mL via ORAL
  Filled 2015-07-20: qty 15

## 2015-07-20 MED ORDER — MAGNESIUM HYDROXIDE 400 MG/5ML PO SUSP: 30.0000 mL | Freq: Every day | ORAL | Status: DC | PRN

## 2015-07-20 MED ORDER — DOCUSATE SODIUM 100 MG PO CAPS
100.0000 mg | ORAL_CAPSULE | Freq: Every day | ORAL | Status: DC
  Administered 2015-07-21 – 2015-07-22 (×2): 100 mg via ORAL
  Filled 2015-07-20 (×2): qty 1

## 2015-07-20 MED ORDER — MEPERIDINE HCL 25 MG/ML IJ SOLN: 6.2500 mg | INTRAMUSCULAR | Status: DC | PRN

## 2015-07-20 MED ORDER — HYDROCODONE-ACETAMINOPHEN 5-325 MG PO TABS: 1.0000 | ORAL_TABLET | Freq: Four times a day (QID) | ORAL | Status: DC | PRN

## 2015-07-20 MED ORDER — SUGAMMADEX SODIUM 200 MG/2ML IV SOLN
INTRAVENOUS | Status: AC
  Filled 2015-07-20: qty 2

## 2015-07-20 MED ORDER — CHLORHEXIDINE GLUCONATE CLOTH 2 % EX PADS: 6.0000 | MEDICATED_PAD | Freq: Once | CUTANEOUS | Status: DC

## 2015-07-20 MED ORDER — SODIUM CHLORIDE 0.9 % IV SOLN
INTRAVENOUS | Status: DC
  Administered 2015-07-20 – 2015-07-22 (×4): via INTRAVENOUS

## 2015-07-20 MED ORDER — HEPARIN SODIUM (PORCINE) 1000 UNIT/ML IJ SOLN
INTRAMUSCULAR | Status: DC | PRN
  Administered 2015-07-20: 5000 [IU] via INTRAVENOUS

## 2015-07-20 MED ORDER — ATORVASTATIN CALCIUM 10 MG PO TABS
10.0000 mg | ORAL_TABLET | Freq: Every day | ORAL | Status: DC
  Administered 2015-07-21: 10 mg via ORAL
  Filled 2015-07-20 (×3): qty 1

## 2015-07-20 MED ORDER — HYDROCODONE-ACETAMINOPHEN 5-325 MG PO TABS
1.0000 | ORAL_TABLET | ORAL | Status: DC | PRN
  Administered 2015-07-20: 1 via ORAL

## 2015-07-20 MED ORDER — FENTANYL CITRATE (PF) 100 MCG/2ML IJ SOLN
INTRAMUSCULAR | Status: DC | PRN
  Administered 2015-07-20: 50 ug via INTRAVENOUS
  Administered 2015-07-20: 100 ug via INTRAVENOUS
  Administered 2015-07-20: 50 ug via INTRAVENOUS

## 2015-07-20 MED ORDER — PROMETHAZINE HCL 25 MG/ML IJ SOLN: 6.2500 mg | INTRAMUSCULAR | Status: DC | PRN

## 2015-07-20 MED ORDER — ROCURONIUM BROMIDE 100 MG/10ML IV SOLN
INTRAVENOUS | Status: DC | PRN
  Administered 2015-07-20: 20 mg via INTRAVENOUS
  Administered 2015-07-20: 30 mg via INTRAVENOUS

## 2015-07-20 MED ORDER — LABETALOL HCL 5 MG/ML IV SOLN: 10.0000 mg | INTRAVENOUS | Status: DC | PRN

## 2015-07-20 MED ORDER — ACETAMINOPHEN 325 MG PO TABS: 325.0000 mg | ORAL_TABLET | ORAL | Status: DC | PRN

## 2015-07-20 MED ORDER — MIDAZOLAM HCL 2 MG/2ML IJ SOLN
INTRAMUSCULAR | Status: AC
  Filled 2015-07-20: qty 2

## 2015-07-20 MED ORDER — PROPOFOL 10 MG/ML IV BOLUS
INTRAVENOUS | Status: AC
  Filled 2015-07-20: qty 20

## 2015-07-20 MED ORDER — ADULT MULTIVITAMIN W/MINERALS CH
1.0000 | ORAL_TABLET | Freq: Every day | ORAL | Status: DC
  Administered 2015-07-21 – 2015-07-22 (×2): 1 via ORAL
  Filled 2015-07-20 (×2): qty 1

## 2015-07-20 MED ORDER — HYDROCODONE-ACETAMINOPHEN 5-325 MG PO TABS
ORAL_TABLET | ORAL | Status: AC
  Administered 2015-07-20: 1 via ORAL
  Filled 2015-07-20: qty 2

## 2015-07-20 MED ORDER — ATORVASTATIN CALCIUM 10 MG PO TABS: 10.0000 mg | ORAL_TABLET | Freq: Every day | ORAL | Status: DC

## 2015-07-20 MED ORDER — LACTATED RINGERS IV SOLN
INTRAVENOUS | Status: DC
  Administered 2015-07-20 (×2): via INTRAVENOUS

## 2015-07-20 MED ORDER — MORPHINE SULFATE (PF) 2 MG/ML IV SOLN: 2.0000 mg | INTRAVENOUS | Status: DC | PRN

## 2015-07-20 MED ORDER — POTASSIUM CHLORIDE CRYS ER 20 MEQ PO TBCR
20.0000 meq | EXTENDED_RELEASE_TABLET | Freq: Every day | ORAL | Status: AC | PRN
  Administered 2015-07-21: 40 meq via ORAL
  Filled 2015-07-20: qty 2

## 2015-07-20 MED ORDER — SODIUM CHLORIDE 0.9 % IV SOLN
10.0000 mg | INTRAVENOUS | Status: DC | PRN
  Administered 2015-07-20: 10 ug/min via INTRAVENOUS

## 2015-07-20 MED ORDER — CEFUROXIME SODIUM 1.5 G IJ SOLR
1.5000 g | INTRAMUSCULAR | Status: AC
  Administered 2015-07-20: 1.5 g via INTRAVENOUS
  Filled 2015-07-20: qty 1.5

## 2015-07-20 MED ORDER — PROTAMINE SULFATE 10 MG/ML IV SOLN
INTRAVENOUS | Status: DC | PRN
  Administered 2015-07-20: 30 mg via INTRAVENOUS

## 2015-07-20 MED ORDER — SODIUM CHLORIDE 0.9 % IV SOLN: INTRAVENOUS | Status: DC

## 2015-07-20 MED ORDER — 0.9 % SODIUM CHLORIDE (POUR BTL) OPTIME
TOPICAL | Status: DC | PRN
  Administered 2015-07-20: 2000 mL

## 2015-07-20 MED ORDER — HEPARIN SODIUM (PORCINE) 5000 UNIT/ML IJ SOLN
INTRAMUSCULAR | Status: DC | PRN
  Administered 2015-07-20: 500 mL

## 2015-07-20 MED ORDER — LIDOCAINE HCL 4 % MT SOLN
OROMUCOSAL | Status: DC | PRN
  Administered 2015-07-20: 4 mL via TOPICAL

## 2015-07-20 MED ORDER — LACTATED RINGERS IV SOLN: INTRAVENOUS | Status: DC

## 2015-07-20 MED ORDER — ENOXAPARIN SODIUM 40 MG/0.4ML ~~LOC~~ SOLN
40.0000 mg | SUBCUTANEOUS | Status: DC
  Administered 2015-07-21: 40 mg via SUBCUTANEOUS
  Filled 2015-07-20: qty 0.4

## 2015-07-20 MED ORDER — HEPARIN SODIUM (PORCINE) 1000 UNIT/ML IJ SOLN
INTRAMUSCULAR | Status: AC
  Filled 2015-07-20: qty 2

## 2015-07-20 MED ORDER — PHENYLEPHRINE HCL 10 MG/ML IJ SOLN
INTRAMUSCULAR | Status: DC | PRN
  Administered 2015-07-20 (×2): 80 ug via INTRAVENOUS

## 2015-07-20 MED ORDER — ONDANSETRON HCL 4 MG/2ML IJ SOLN
4.0000 mg | Freq: Four times a day (QID) | INTRAMUSCULAR | Status: DC | PRN
  Administered 2015-07-20: 4 mg via INTRAVENOUS
  Filled 2015-07-20: qty 2

## 2015-07-20 MED ORDER — PANTOPRAZOLE SODIUM 40 MG PO TBEC
40.0000 mg | DELAYED_RELEASE_TABLET | Freq: Every day | ORAL | Status: DC
  Administered 2015-07-20 – 2015-07-22 (×3): 40 mg via ORAL
  Filled 2015-07-20 (×3): qty 1

## 2015-07-20 MED ORDER — METOPROLOL TARTRATE 1 MG/ML IV SOLN
2.0000 mg | INTRAVENOUS | Status: DC | PRN
Start: 2015-07-20 — End: 2015-07-22

## 2015-07-20 MED ORDER — ALUM & MAG HYDROXIDE-SIMETH 200-200-20 MG/5ML PO SUSP: 15.0000 mL | ORAL | Status: DC | PRN

## 2015-07-20 MED ORDER — HYDROMORPHONE HCL 1 MG/ML IJ SOLN
INTRAMUSCULAR | Status: AC
  Administered 2015-07-20: 0.5 mg via INTRAVENOUS
  Filled 2015-07-20: qty 1

## 2015-07-20 MED ORDER — LIDOCAINE HCL (CARDIAC) 20 MG/ML IV SOLN
INTRAVENOUS | Status: DC | PRN
  Administered 2015-07-20: 60 mg via INTRAVENOUS

## 2015-07-20 MED ORDER — ROCURONIUM BROMIDE 50 MG/5ML IV SOLN
INTRAVENOUS | Status: AC
  Filled 2015-07-20: qty 1

## 2015-07-20 MED ORDER — ASPIRIN EC 325 MG PO TBEC
325.0000 mg | DELAYED_RELEASE_TABLET | Freq: Every day | ORAL | Status: DC
  Administered 2015-07-20 – 2015-07-22 (×2): 325 mg via ORAL
  Filled 2015-07-20 (×3): qty 1

## 2015-07-20 MED ORDER — BISACODYL 10 MG RE SUPP: 10.0000 mg | Freq: Every day | RECTAL | Status: DC | PRN

## 2015-07-20 MED ORDER — FENTANYL CITRATE (PF) 250 MCG/5ML IJ SOLN
INTRAMUSCULAR | Status: AC
  Filled 2015-07-20: qty 5

## 2015-07-20 MED ORDER — ONDANSETRON HCL 4 MG/2ML IJ SOLN
INTRAMUSCULAR | Status: DC | PRN
  Administered 2015-07-20: 4 mg via INTRAVENOUS

## 2015-07-20 MED ORDER — ACETAMINOPHEN 650 MG RE SUPP
325.0000 mg | RECTAL | Status: DC | PRN
Start: 2015-07-20 — End: 2015-07-22

## 2015-07-20 MED ORDER — SODIUM CHLORIDE 0.9 % IV SOLN: 500.0000 mL | Freq: Once | INTRAVENOUS | Status: DC | PRN

## 2015-07-20 SURGICAL SUPPLY — 51 items
BAG ISL DRAPE 18X18 STRL (DRAPES) ×2
BAG ISOLATION DRAPE 18X18 (DRAPES) ×1 IMPLANT
BANDAGE ELASTIC 4 VELCRO ST LF (GAUZE/BANDAGES/DRESSINGS) IMPLANT
BANDAGE ESMARK 6X9 LF (GAUZE/BANDAGES/DRESSINGS) IMPLANT
BNDG CMPR 9X6 STRL LF SNTH (GAUZE/BANDAGES/DRESSINGS)
BNDG ESMARK 6X9 LF (GAUZE/BANDAGES/DRESSINGS)
CANISTER SUCTION 2500CC (MISCELLANEOUS) ×4 IMPLANT
CANNULA VESSEL 3MM 2 BLNT TIP (CANNULA) ×3 IMPLANT
CLIP TI MEDIUM 24 (CLIP) ×4 IMPLANT
CLIP TI WIDE RED SMALL 24 (CLIP) ×4 IMPLANT
CUFF TOURNIQUET SINGLE 18IN (TOURNIQUET CUFF) IMPLANT
CUFF TOURNIQUET SINGLE 24IN (TOURNIQUET CUFF) IMPLANT
CUFF TOURNIQUET SINGLE 34IN LL (TOURNIQUET CUFF) IMPLANT
CUFF TOURNIQUET SINGLE 44IN (TOURNIQUET CUFF) IMPLANT
DRAIN CHANNEL 15F RND FF W/TCR (WOUND CARE) IMPLANT
DRAPE ISOLATION BAG 18X18 (DRAPES) ×2
DRSG COVADERM 4X8 (GAUZE/BANDAGES/DRESSINGS) IMPLANT
ELECT REM PT RETURN 9FT ADLT (ELECTROSURGICAL) ×4
ELECTRODE REM PT RTRN 9FT ADLT (ELECTROSURGICAL) ×2 IMPLANT
EVACUATOR SILICONE 100CC (DRAIN) IMPLANT
GLOVE BIO SURGEON STRL SZ 6.5 (GLOVE) ×4 IMPLANT
GLOVE BIO SURGEON STRL SZ7.5 (GLOVE) ×4 IMPLANT
GLOVE BIO SURGEONS STRL SZ 6.5 (GLOVE) ×2
GLOVE BIOGEL PI IND STRL 6.5 (GLOVE) ×4 IMPLANT
GLOVE BIOGEL PI IND STRL 8 (GLOVE) ×2 IMPLANT
GLOVE BIOGEL PI INDICATOR 6.5 (GLOVE) ×8
GLOVE BIOGEL PI INDICATOR 8 (GLOVE) ×2
GLOVE ECLIPSE 7.5 STRL STRAW (GLOVE) ×3 IMPLANT
GOWN STRL REUS W/ TWL LRG LVL3 (GOWN DISPOSABLE) ×6 IMPLANT
GOWN STRL REUS W/ TWL XL LVL3 (GOWN DISPOSABLE) ×1 IMPLANT
GOWN STRL REUS W/TWL LRG LVL3 (GOWN DISPOSABLE) ×12
GOWN STRL REUS W/TWL XL LVL3 (GOWN DISPOSABLE) ×4
KIT BASIN OR (CUSTOM PROCEDURE TRAY) ×4 IMPLANT
KIT ROOM TURNOVER OR (KITS) ×4 IMPLANT
LIQUID BAND (GAUZE/BANDAGES/DRESSINGS) ×3 IMPLANT
NS IRRIG 1000ML POUR BTL (IV SOLUTION) ×8 IMPLANT
PACK PERIPHERAL VASCULAR (CUSTOM PROCEDURE TRAY) ×4 IMPLANT
PAD ARMBOARD 7.5X6 YLW CONV (MISCELLANEOUS) ×8 IMPLANT
PADDING CAST COTTON 6X4 STRL (CAST SUPPLIES) IMPLANT
SPONGE SURGIFOAM ABS GEL 100 (HEMOSTASIS) IMPLANT
STAPLER VISISTAT (STAPLE) IMPLANT
SUT PROLENE 5 0 C 1 24 (SUTURE) ×7 IMPLANT
SUT PROLENE 6 0 BV (SUTURE) ×7 IMPLANT
SUT SILK 2 0 FS (SUTURE) IMPLANT
SUT VIC AB 2-0 CTB1 (SUTURE) ×7 IMPLANT
SUT VIC AB 3-0 SH 27 (SUTURE) ×12
SUT VIC AB 3-0 SH 27X BRD (SUTURE) ×4 IMPLANT
SUT VICRYL 4-0 PS2 18IN ABS (SUTURE) ×7 IMPLANT
TRAY FOLEY W/METER SILVER 16FR (SET/KITS/TRAYS/PACK) ×4 IMPLANT
UNDERPAD 30X30 INCONTINENT (UNDERPADS AND DIAPERS) ×4 IMPLANT
WATER STERILE IRR 1000ML POUR (IV SOLUTION) ×4 IMPLANT

## 2015-07-20 NOTE — Discharge Summary (Signed)
Discharge Summary    Anna Barrera 02-Jul-1946 69 y.o. female  [id number]  Admission Date: 07/20/2015  Discharge Date: 07/22/15  Physician: Angelia Mould, MD  Admission Diagnosis: Right common femoral artery pseudoaneurysm I72.4   HPI:   This is a 69 y.o. female who underwent a right femoral to below knee popliteal artery bypass with a vein graft on 06/16/2012. This was for ischemic toes of the right foot and infrainguinal arterial occlusive disease. In July she developed some right thigh pain which she noted gradually. She was seen in the [facility] the emergency department. A CT scan showed a large midline supraumbilical ventral hernia. She has undergone prior mesh repair. She's had previous sigmoid colectomy and cholecystectomy. An incidental finding was a 2.3 cm x 1.7 cm pseudoaneurysm of the right common femoral artery near the origin of her bypass graft. Therefore vascular surgery consult was recommended.  The patient states that the pain in her right thigh has essentially resolved. She denies significant right lower extremity swelling or claudication. Currently she is not having any significant groin pain.  She tells me that she was told told that she was too high-risk for repair of her ventral hernia and she is concerned about having any surgery.  She smokes 1 pack per day of cigarettes. She denies any chest pain or chest pressure. She does admit to orthopnea and dyspnea on exertion. She is currently not on home oxygen.  Hospital Course:  The patient was admitted to the hospital and taken to the operating room on 07/20/2015 and underwent: repair of pseudoaneurysm of right proximal femoropopliteal bypass graft   The pt tolerated the procedure well and was transported to the PACU in good condition.   She did have some N/V overnight, but this was resolved by morning.  She also c/o headache and ibuprofen was ordered.    Her postoperative ABI's are:  RIGHT     LEFT    PRESSURE WAVEFORM  PRESSURE WAVEFORM  BRACHIAL 141 Triphasic BRACHIAL 125 Triphasic   DP 121 Triphasic  DP 84 Monophasic   AT   AT    PT 129 Triphasic  PT absent   PER   PER 62 Monophasic   GREAT TOE  NA GREAT TOE  NA    RIGHT LEFT  ABI 0.91 0.6       By POD 2, she had palpable DP pulses bilaterally.   She was discharged home.  The remainder of the hospital course consisted of increasing mobilization and increasing intake of solids without difficulty.  CBC    Component Value Date/Time   WBC 10.9* 07/18/2015 1530   RBC 4.56 07/18/2015 1530   HGB 14.6 07/18/2015 1530   HCT 42.0 07/18/2015 1530   PLT 198 07/18/2015 1530   MCV 92.1 07/18/2015 1530   MCH 32.0 07/18/2015 1530   MCHC 34.8 07/18/2015 1530   RDW 12.6 07/18/2015 1530   LYMPHSABS 2.0 06/10/2012 0540   MONOABS 0.6 06/10/2012 0540   EOSABS 0.2 06/10/2012 0540   BASOSABS 0.1 06/10/2012 0540    BMET    Component Value Date/Time   NA 140 07/18/2015 1530   K 3.6 07/18/2015 1530   CL 101 07/18/2015 1530   CO2 28 07/18/2015 1530   GLUCOSE 105* 07/18/2015 1530   BUN 11 07/18/2015 1530   CREATININE 0.88 07/18/2015 1530   CALCIUM 9.9 07/18/2015 1530   GFRNONAA >60 07/18/2015 1530   GFRAA >60 07/18/2015 1530      Discharge Instructions  Call MD for:  redness, tenderness, or signs of infection (pain, swelling, bleeding, redness, odor or green/yellow discharge around incision site)    Complete by:  As directed      Call MD for:  severe or increased pain, loss or decreased feeling  in affected limb(s)    Complete by:  As directed      Call MD for:  temperature >100.5    Complete by:  As directed      Discharge wound care:    Complete by:  As directed   Wash the groin wound with soap and water daily and pat dry. (No tub bath-only shower)  Then put a dry gauze or washcloth there to keep this area dry daily and as needed to help prevent wound infection..   Do not use Vaseline or neosporin on your incisions.  Only use soap and water on your incisions and then protect and keep dry.     Driving Restrictions    Complete by:  As directed   No driving for 2 weeks     Lifting restrictions    Complete by:  As directed   No lifting for 4 weeks     Resume previous diet    Complete by:  As directed            Discharge Diagnosis:  Right common femoral artery pseudoaneurysm I72.4  Secondary Diagnosis: Patient Active Problem List   Diagnosis Date Noted  . Fever 06/11/2012  . Arterial insufficiency with ischemic ulcer 06/10/2012  . Cellulitis of foot 06/10/2012  . Tobacco abuse 06/10/2012  . Pre-operative cardiovascular examination 06/10/2012   Past Medical History  Diagnosis Date  . Seasonal allergies   . Perforated bowel   . Complication of anesthesia     "hard time waking up"  . Peripheral vascular disease   . Depression   . Hay fever   . Constipation   . Anemia   . COPD (chronic obstructive pulmonary disease)   . Cancer     BCC -  Left upper chest  . Incisional hernia        Medication List    TAKE these medications        aspirin 325 MG EC tablet  Take 325 mg by mouth daily as needed for pain.     atorvastatin 10 MG tablet  Commonly known as:  LIPITOR  Take 1 tablet (10 mg total) by mouth daily.     HYDROcodone-acetaminophen 5-325 MG per tablet  Commonly known as:  NORCO/VICODIN  Take 1-2 tablets by mouth every 6 (six) hours as needed.     multivitamin with minerals Tabs tablet  Take 1 tablet by mouth daily. One a Day        Prescriptions given: 1.  Vicodin #30 No Refill 2.  Lipitor 10mg  #30 3 Refills.  (Refills per PCP or cardiology).  Instructions: 1.  Wash the groin wound with soap and water daily and pat dry. (No tub bath-only shower)  Then put a dry gauze or washcloth there to keep this area dry daily and as needed to help prevent wound infection.  Do not use Vaseline or neosporin on your incisions.   Only use soap and water on your incisions and then protect and keep dry.  Disposition: home  Patient's condition: is Good  Follow up: 1. Dr. Scot Dock in 2 weeks   Leontine Locket, PA-C [facility] [phone] 07/20/2015  2:06 PM

## 2015-07-20 NOTE — Interval H&P Note (Signed)
History and Physical Interval Note:  07/20/2015 10:59 AM  Anna Barrera  has presented today for surgery, with the diagnosis of Right common femoral artery pseudoaneurysm I72.4  The various methods of treatment have been discussed with the patient and family. After consideration of risks, benefits and other options for treatment, the patient has consented to  Procedure(s): REPAIR FEMORAL PSEUDOANEURYSM (Right) VEIN HARVEST (Left) as a surgical intervention .  The patient's history has been reviewed, patient examined, no change in status, stable for surgery.  I have reviewed the patient's chart and labs.  Questions were answered to the patient's satisfaction.     Deitra Mayo

## 2015-07-20 NOTE — Anesthesia Postprocedure Evaluation (Signed)
  Anesthesia Post-op Note  Patient: Anna Barrera  Procedure(s) Performed: Procedure(s): REPAIR RIGHT FEMORAL PSEUDOANEURYSM (Right)  Patient Location: PACU  Anesthesia Type:General  Level of Consciousness: awake, alert  and oriented  Airway and Oxygen Therapy: Patient Spontanous Breathing  Post-op Pain: none  Post-op Assessment: Post-op Vital signs reviewed and Patient's Cardiovascular Status Stable              Post-op Vital Signs: Reviewed  Last Vitals:  Filed Vitals:   07/20/15 1455  BP: 132/69  Pulse: 90  Temp:   Resp: 17    Complications: No apparent anesthesia complications

## 2015-07-20 NOTE — Op Note (Signed)
    NAME: Anna Barrera   MRN: [id number] DOB: 1946-04-12    DATE OF OPERATION: 07/20/2015  PREOP DIAGNOSIS: Pseudoaneurysm of proximal right femoropopliteal bypass graft  POSTOP DIAGNOSIS: same  PROCEDURE: repair of pseudoaneurysm of right proximal femoropopliteal bypass graft  SURGEON: Judeth Cornfield. Scot Dock, MD, FACS  ASSIST: Leontine Locket, PA  ANESTHESIA: Gen.   EBL: minimal  INDICATIONS: [name] is a 69 y.o. female who underwent a right femoral to below knee popliteal artery bypass with a vein graft in 2013. She was seen at [facility] emergency department with a supraumbilical ventral hernia and underwent a CT scan. An incidental finding was a pseudoaneurysm involving the right common femoral artery at the origin of the bypass graft. I recommended elective repair.  FINDINGS: The proximal vein had dilated significantly there was no significant aneurysm of the common femoral artery. I elected to simply plicate the proximal vein bypass.  TECHNIQUE: The patient was taken to the operative room and received a general anesthetic. Both lower extremities and the lower abdomen and groins were prepped and draped in usual sterile fashion. The previous incision in the right groin was opened and through this incision I dissected free the proximal right femoropopliteal bypass graft, the common femoral artery, the superficial femoral artery, and the deep femoral artery. The patient was heparinized. The common femoral artery itself did not appear aneurysmal. The proximal vein had dilated and was somewhat aneurysmal. I elected to plicate this area. After the arteries were all controlled a longitudinal venotomy was made in the hood of the bypass graft and an ellipse of vein was excised. Was a small amount of laminated thrombus within the graft which was removed. I then repaired  The excised segment with a running 50 proline suture. At the completion there was good hemostasis. The deep layer  was then closed with a running 2-0 Vicryl. The subcutaneous layer was closed with 3-0 Vicryl. The skin was closed with a 4-0 subcuticular stitch. The Potvin was applied. The patient tolerated she well and was transferred to the recovery room in stable condition. All needle and sponge counts were correct.  Deitra Mayo, MD, FACS [facility]  DATE OF DICTATION:   07/20/2015

## 2015-07-20 NOTE — Transfer of Care (Signed)
Immediate Anesthesia Transfer of Care Note  Patient: Anna Barrera  Procedure(s) Performed: Procedure(s): REPAIR RIGHT FEMORAL PSEUDOANEURYSM (Right)  Patient Location: PACU  Anesthesia Type:General  Level of Consciousness: sedated, patient cooperative and responds to stimulation  Airway & Oxygen Therapy: Patient Spontanous Breathing and Patient connected to face mask oxygen  Post-op Assessment: Report given to RN, Post -op Vital signs reviewed and stable, Patient moving all extremities and Patient moving all extremities X 4  Post vital signs: Reviewed and stable  Last Vitals:  Filed Vitals:   07/20/15 0945  BP: 171/76  Pulse: 113  Temp: 37.2 C  Resp: 20    Complications: No apparent anesthesia complications

## 2015-07-20 NOTE — Anesthesia Procedure Notes (Signed)
Procedure Name: Intubation Date/Time: 07/20/2015 12:24 PM Performed by: Jacquiline Doe A Pre-anesthesia Checklist: Patient identified, Emergency Drugs available, Suction available, Patient being monitored and Timeout performed Patient Re-evaluated:Patient Re-evaluated prior to inductionOxygen Delivery Method: Circle system utilized Preoxygenation: Pre-oxygenation with 100% oxygen Intubation Type: IV induction Laryngoscope Size: Mac and 3 Grade View: Grade I Tube type: Oral Tube size: 7.0 mm Number of attempts: 1 Airway Equipment and Method: Stylet and LTA kit utilized Placement Confirmation: ETT inserted through vocal cords under direct vision,  positive ETCO2 and breath sounds checked- equal and bilateral Secured at: 20 cm Tube secured with: Tape Dental Injury: Teeth and Oropharynx as per pre-operative assessment

## 2015-07-20 NOTE — H&P (View-Only) (Signed)
Patient ID: Anna Barrera, female   DOB: 01/09/1946, 69 y.o.   MRN: [id number]   [facility] [place]  Patient name: Anna Barrera MRN: [id number] DOB: 10/14/1946 Sex: female  REASON FOR VISIT: Pseudoaneurysm of right common femoral artery  HPI: Sahiti L Belford is a 69 y.o. female who underwent a right femoral to below knee popliteal artery bypass with a vein graft on 06/16/2012. This was for ischemic toes of the right foot and infrainguinal arterial occlusive disease.  In July she developed some right thigh pain which she noted gradually. She was seen in the [facility] the emergency department. A CT scan showed a large midline supraumbilical ventral hernia. She has undergone prior mesh repair. She's had previous sigmoid colectomy and cholecystectomy. An incidental finding was a 2.3 cm x 1.7 cm pseudoaneurysm of the right common femoral artery near the origin of her bypass graft. Therefore vascular surgery consult was recommended.  The patient states that the pain in her right thigh has essentially resolved. She denies significant right lower extremity swelling or claudication. Currently she is not having any significant groin pain.  She tells me that she was told told that she was too high-risk for repair of her ventral hernia and she is concerned about having any surgery.  She smokes 1 pack per day of cigarettes. She denies any chest pain or chest pressure. She does admit to orthopnea and dyspnea on exertion. She is currently not on home oxygen.   Past Medical History  Diagnosis Date  . Seasonal allergies   . Perforated bowel   . Complication of anesthesia     "hard time waking up"  . Peripheral vascular disease   . Depression   . Hay fever   . Constipation   . Anemia   . COPD (chronic obstructive pulmonary disease)   . Cancer     BCC -  Left upper chest   Family History  Problem Relation Age of Onset  . Breast cancer Mother   . Cancer Mother     . Cancer Father   . Diabetes Brother    SOCIAL HISTORY: Social History  Substance Use Topics  . Smoking status: Current Every Day Smoker -- 0.10 packs/day  . Smokeless tobacco: Never Used  . Alcohol Use: No   No Known Allergies Current Outpatient Prescriptions  Medication Sig Dispense Refill  . aspirin 325 MG EC tablet Take 325 mg by mouth every 4 (four) hours as needed for pain.     . Multiple Vitamins-Minerals (MULTIVITAMIN ADULTS 50+ PO) Take 1 tablet by mouth daily.    . HYDROcodone-acetaminophen (NORCO/VICODIN) 5-325 MG per tablet Take 2 tablets by mouth every 4 (four) hours as needed. (Patient not taking: Reported on 06/21/2015) 6 tablet 0  . ibuprofen (ADVIL,MOTRIN) 200 MG tablet Take 200-400 mg by mouth every 12 (twelve) hours as needed for headache, mild pain or moderate pain.    . simvastatin (ZOCOR) 20 MG tablet Take 1 tablet (20 mg total) by mouth daily at 6 PM. 30 tablet 1   No current facility-administered medications for this visit.   REVIEW OF SYSTEMS: [X ] denotes positive finding; [  ] denotes negative finding  CARDIOVASCULAR:  [ ] chest pain   [ ] chest pressure   [ ] palpitations   [X ] orthopnea   [X ] dyspnea on exertion   [ ] claudication   [ ] rest pain   [ ] DVT   [ ]   phlebitis PULMONARY:   [X ] productive cough   [ ] asthma   [X ] wheezing NEUROLOGIC:   [ ] weakness  [ ] paresthesias  [ ] aphasia  [ ] amaurosis  [X ] dizziness HEMATOLOGIC:   [ ] bleeding problems   [ ] clotting disorders MUSCULOSKELETAL:  [ ] joint pain   [ ] joint swelling [X ] leg swelling GASTROINTESTINAL: [ ]  blood in stool  [ ]  hematemesis GENITOURINARY:  [ ]  dysuria  [ ]  hematuria PSYCHIATRIC:  [ ] history of major depression INTEGUMENTARY:  [ ] rashes  [ ] ulcers CONSTITUTIONAL:  [ ] fever   [ ] chills  PHYSICAL EXAM: Filed Vitals:   06/21/15 1051 06/21/15 1101  BP: 165/116 141/85  Pulse: 90 81  Temp: 98.4 F (36.9 C)   TempSrc: Oral   Resp: 18   Height: 5' 2" (1.575 m)    Weight: 136 lb (61.689 kg)   SpO2: 97%    GENERAL: The patient is a well-nourished female, in no acute distress. The vital signs are documented above. CARDIAC: There is a regular rate and rhythm.  VASCULAR: I do not detect carotid bruits. On the right side, she has a palpable femoral and popliteal pulse. I cannot palpate pedal pulses over the right foot is warm and well-perfused. On the left side, she has a palpable femoral pulse. I cannot palpate pedal pulses on the left. PULMONARY: There is good air exchange bilaterally without wheezing or rales. ABDOMEN: Soft and non-tender with normal pitched bowel sounds. She has a large hernia. MUSCULOSKELETAL: There are no major deformities or cyanosis. NEUROLOGIC: No focal weakness or paresthesias are detected. SKIN: There are no ulcers or rashes noted. PSYCHIATRIC: The patient has a normal affect.  DATA:  I have independently interpreted her lower extremity arterial duplex today which shows triphasic Doppler signals throughout her bypass graft on the right. There is some aneurysmal dilatation of the right common femoral artery and proximal anastomosis which measures 2.2 cm in maximum diameter. No areas of significant stenosis are noted within the graft.  I have independently interpreted her arterial Doppler study which shows an ABI of 100% on the right and 58% on the left. She has triphasic Doppler signals in the right foot in the posterior tibial and dorsalis pedis positions. She has biphasic Doppler signals in the left foot and the dorsalis pedis and posterior tibial positions.  CT SCAN: CT shows the pseudoaneurysm involving her common femoral artery and proximal bypass graft.  MEDICAL ISSUES:  PSEUDOANEURYSM OF THE RIGHT COMMON FEMORAL ARTERY ADJACENT TO HER BYPASS GRAFT: Given the pseudoaneurysm in the right groin I would recommend elective repair of this. This could potentially require taking vein from the left leg. However, she is concerned  that she is at high-risk for surgery because of her lungs. For this reason we will get medical clearance from her primary care doctor and if necessary have her undergo pulmonary evaluation. Once she is cleared we'll schedule her for repair of the right femoral pseudoaneurysm which could potentially require taking vein from the left leg to revise the proximal aspect of her right femoral to below-knee popliteal bypass graft. Have discussed the procedure and potential, occasions in the office today. If she is cleared we could tentatively schedule her for surgery on September 13.  HYPERTENSION: The patient's initial blood pressure today was elevated. We repeated this and this was still elevated. We have encouraged the patient to follow   up with their primary care physician for management of their blood pressure.  Dickson, Christopher [facility] [place] Beeper: [phone]    

## 2015-07-20 NOTE — Care Management Note (Addendum)
Case Management Note  Patient Details  Name: Anna Barrera MRN: [id number] Date of Birth: [birth year]  Subjective/Objective:              Admitted with right common femoral artery pseudoaneurysm , s/p  REPAIR OF RIGHT FEMORAL PSEUDOANEURYSM.  Action/Plan: Return to home when medically stable. CM to f/u with discharge disposition.  Expected Discharge Date:                  Expected Discharge Plan:  Home/Self Care  In-House Referral:     Discharge planning Services  CM Consult  Post Acute Care Choice:    Choice offered to:     DME Arranged:    DME Agency:     HH Arranged:    HH Agency:     Status of Service:  In process, will continue to follow  Medicare Important Message Given:    Date Medicare IM Given:    Medicare IM give by:    Date Additional Medicare IM Given:    Additional Medicare Important Message give by:     If discussed at [place] of Stay Meetings, dates discussed:    Additional Comments: Mittie Bodo [facility]) [phone]  Whitman Hero [place], [place] [phone] 07/20/2015, 7:33 PM

## 2015-07-20 NOTE — Anesthesia Preprocedure Evaluation (Addendum)
Anesthesia Evaluation  Patient identified by MRN, date of birth, ID band Patient awake    Reviewed: Allergy & Precautions, NPO status , Patient's Chart, lab work & pertinent test results  Airway Mallampati: II       Dental  (+) Upper Dentures, Dental Advisory Given   Pulmonary COPD, Current Smoker,    breath sounds clear to auscultation       Cardiovascular + Peripheral Vascular Disease   Rhythm:Regular Rate:Normal     Neuro/Psych PSYCHIATRIC DISORDERS Depression    GI/Hepatic negative GI ROS, Neg liver ROS,   Endo/Other  negative endocrine ROS  Renal/GU negative Renal ROS  negative genitourinary   Musculoskeletal negative musculoskeletal ROS (+)   Abdominal   Peds negative pediatric ROS (+)  Hematology negative hematology ROS (+)   Anesthesia Other Findings   Reproductive/Obstetrics negative OB ROS                            Lab Results  Component Value Date   WBC 10.9* 07/18/2015   HGB 14.6 07/18/2015   HCT 42.0 07/18/2015   MCV 92.1 07/18/2015   PLT 198 07/18/2015   Lab Results  Component Value Date   CREATININE 0.88 07/18/2015   BUN 11 07/18/2015   NA 140 07/18/2015   K 3.6 07/18/2015   CL 101 07/18/2015   CO2 28 07/18/2015   Lab Results  Component Value Date   INR 1.08 07/18/2015   INR 1.00 06/15/2012   EKG: normal sinus rhythm.  Echo (2013): - Left ventricle: The cavity size was normal. Wall thickness was increased in a pattern of mild LVH. The estimated ejection fraction was 60%. Wall motion was normal; there were no regional wall motion abnormalities. - Tricuspid valve: Mild regurgitation. - Pulmonary arteries: PA peak pressure: 17mm Hg (S). - Impressions: There are no significant valvular abnormalities.  Anesthesia Physical Anesthesia Plan  ASA: III  Anesthesia Plan: General   Post-op Pain Management:    Induction: Intravenous  Airway  Management Planned: Oral ETT  Additional Equipment:   Intra-op Plan:   Post-operative Plan: Extubation in OR  Informed Consent: I have reviewed the patients History and Physical, chart, labs and discussed the procedure including the risks, benefits and alternatives for the proposed anesthesia with the patient or authorized representative who has indicated his/her understanding and acceptance.   Dental advisory given  Plan Discussed with: CRNA  Anesthesia Plan Comments:         Anesthesia Quick Evaluation

## 2015-07-20 NOTE — Progress Notes (Addendum)
  Day of Surgery Note    Subjective:  C/o pain and feels like she needs to pee despite foley in place  Filed Vitals:   07/20/15 1430  BP: 112/70  Pulse: 90  Temp:   Resp: 15    Incisions:   Right groin incision is clean and dry Extremities:  1-2+ palpable right DP pulse Cardiac:  regular Lungs:  Non labored   Assessment/Plan:  This is a 68 y.o. female who is s/p Pseudoaneurysm of right common femoral artery  -pt with palpable right DP pulse -systolic BP in left arm 09-81 points higher.  Systolic in left arm in 191.   -pt doing well.  To 3 south when bed available -anticipate discharge in next day or two -pt is not on a statin-No allergies, therefore low dose lipitor started  Leontine Locket, PA-C 07/20/2015 2:53 PM  Agree with above. Incision looks fine. Should be ready for discharge tomorrow.  Deitra Mayo, MD, [place] [phone] Office: [phone]

## 2015-07-21 ENCOUNTER — Telehealth: Payer: Self-pay | Admitting: Vascular Surgery

## 2015-07-21 ENCOUNTER — Inpatient Hospital Stay (HOSPITAL_COMMUNITY): Payer: PPO

## 2015-07-21 ENCOUNTER — Encounter (HOSPITAL_COMMUNITY): Payer: Self-pay | Admitting: Vascular Surgery

## 2015-07-21 DIAGNOSIS — I724 Aneurysm of artery of lower extremity: Secondary | ICD-10-CM

## 2015-07-21 DIAGNOSIS — T82868A Thrombosis of vascular prosthetic devices, implants and grafts, initial encounter: Secondary | ICD-10-CM | POA: Diagnosis not present

## 2015-07-21 LAB — CBC
HCT: 34.2 % — ABNORMAL LOW (ref 36.0–46.0)
Hemoglobin: 11.6 g/dL — ABNORMAL LOW (ref 12.0–15.0)
MCH: 31.8 pg (ref 26.0–34.0)
MCHC: 33.9 g/dL (ref 30.0–36.0)
MCV: 93.7 fL (ref 78.0–100.0)
PLATELETS: 152 10*3/uL (ref 150–400)
RBC: 3.65 MIL/uL — AB (ref 3.87–5.11)
RDW: 12.8 % (ref 11.5–15.5)
WBC: 10.2 10*3/uL (ref 4.0–10.5)

## 2015-07-21 LAB — BASIC METABOLIC PANEL
ANION GAP: 8 (ref 5–15)
BUN: 7 mg/dL (ref 6–20)
CALCIUM: 8.3 mg/dL — AB (ref 8.9–10.3)
CO2: 26 mmol/L (ref 22–32)
Chloride: 106 mmol/L (ref 101–111)
Creatinine, Ser: 0.91 mg/dL (ref 0.44–1.00)
Glucose, Bld: 99 mg/dL (ref 65–99)
POTASSIUM: 3.6 mmol/L (ref 3.5–5.1)
SODIUM: 140 mmol/L (ref 135–145)

## 2015-07-21 LAB — MAGNESIUM: MAGNESIUM: 1.8 mg/dL (ref 1.7–2.4)

## 2015-07-21 LAB — POTASSIUM: POTASSIUM: 3.4 mmol/L — AB (ref 3.5–5.1)

## 2015-07-21 MED ORDER — IBUPROFEN 200 MG PO TABS
200.0000 mg | ORAL_TABLET | ORAL | Status: DC | PRN
  Administered 2015-07-21: 200 mg via ORAL
  Filled 2015-07-21: qty 1

## 2015-07-21 NOTE — Progress Notes (Signed)
Paged PA maureen collins, pt had 12 beat run vtach, new orders rec'd for 12lead ekg, and labs k+,mag to be drawn, notify md of results.

## 2015-07-21 NOTE — Progress Notes (Signed)
In transferring patient i was told of transfer being approved at 1730 , I did not receive a call from Burnett Sheng on 3S until  1840 and again at 1845, sect did not tell me that she had made the call , Nurse Arbie Cookey explained patient had run of V-tach and had to call MD about V Tach and  some lab draw en report was taken at 1850.

## 2015-07-21 NOTE — Progress Notes (Signed)
VASCULAR LAB PRELIMINARY  ARTERIAL  ABI completed:    RIGHT    LEFT    PRESSURE WAVEFORM  PRESSURE WAVEFORM  BRACHIAL 141 Triphasic BRACHIAL 125 Triphasic   DP 121 Triphasic  DP 84 Monophasic   AT   AT    PT 129 Triphasic  PT absent   PER   PER 62 Monophasic   GREAT TOE  NA GREAT TOE  NA    RIGHT LEFT  ABI 0.91 0.6     Cestone,Helene, RVT 07/21/2015, 12:45 PM

## 2015-07-21 NOTE — Progress Notes (Addendum)
Paged MD to clarify potassium order. Potassium came back 3.4. Pt asymptomatic. Will continue to monitor.  Anna Barrera   Dr. Bridgett Larsson returned page at [phone]. Ordered to give 40 mEq of K PO.

## 2015-07-21 NOTE — Evaluation (Signed)
Physical Therapy Evaluation Patient Details Name: Anna Barrera MRN: [id number] DOB: 18-Jul-1946 Today's Date: 07/21/2015   History of Present Illness  This 69 y.o. female admitted for repair of pseudoaneurysm of Rt proximal femoropopliteal bypass graft.  PMH includes:  PVD, depression, COPD`  Clinical Impression  Pt admitted with/for above graft repair.  Pt currently limited functionally due to the problems listed below.  (see problems list.)  Pt will benefit from PT to maximize function and safety to be able to get home safely with available assist of family.     Follow Up Recommendations No PT follow up;Supervision - Intermittent    Equipment Recommendations       Recommendations for Other Services       Precautions / Restrictions Precautions Precautions: Fall      Mobility  Bed Mobility Overal bed mobility: Modified Independent                Transfers Overall transfer level: Needs assistance Equipment used: Rolling walker (2 wheeled) Transfers: Sit to/from Stand Sit to Stand: Supervision Stand pivot transfers: Supervision          Ambulation/Gait Ambulation/Gait assistance: Supervision Ambulation Distance (Feet): 280 Feet Assistive device: None;Rolling walker (2 wheeled) Gait Pattern/deviations: Step-through pattern   Gait velocity interpretation: at or above normal speed for age/gender General Gait Details: a little more antalgic without RW, otherwise steady and safe  Stairs Stairs: Yes   Stair Management: One rail Right;Step to pattern;Alternating pattern;Forwards Number of Stairs: 2 General stair comments: safe with rail  Wheelchair Mobility    Modified Rankin (Stroke Patients Only)       Balance Overall balance assessment: No apparent balance deficits (not formally assessed) Sitting-balance support: Feet supported Sitting balance-Leahy Scale: Good     Standing balance support: No upper extremity supported Standing  balance-Leahy Scale: Fair                               Pertinent Vitals/Pain Pain Assessment: 0-10 Pain Score: 2  Pain Location: R LE Pain Descriptors / Indicators: Grimacing Pain Intervention(s): Monitored during session    Home Living Family/patient expects to be discharged to:: Private residence Living Arrangements: Children Available Help at Discharge: Family;Available 24 hours/day Type of Home: Mobile home Home Access: Stairs to enter Entrance Stairs-Rails: Right;Left Entrance Stairs-Number of Steps: 5 Home Layout: One level Home Equipment: Walker - 2 wheels;Bedside commode      Prior Function Level of Independence: Independent         Comments: Pt does not drive      Hand Dominance   Dominant Hand: Right    Extremity/Trunk Assessment   Upper Extremity Assessment: Overall WFL for tasks assessed           Lower Extremity Assessment: Overall WFL for tasks assessed      Cervical / Trunk Assessment: Normal  Communication      Cognition Arousal/Alertness: Awake/alert Behavior During Therapy: WFL for tasks assessed/performed Overall Cognitive Status: Within Functional Limits for tasks assessed                      General Comments General comments (skin integrity, edema, etc.): EHR 103    Exercises        Assessment/Plan    PT Assessment Patient needs continued PT services  PT Diagnosis     PT Problem List Decreased activity tolerance;Decreased mobility;Decreased knowledge of use of DME;Pain  PT Treatment  Interventions DME instruction;Gait training;Functional mobility training;Therapeutic activities;Patient/family education;Therapeutic exercise   PT Goals (Current goals can be found in the Care Plan section) Acute Rehab PT Goals Patient Stated Goal: to go home  PT Goal Formulation: With patient Time For Goal Achievement: 07/28/15 Potential to Achieve Goals: Good    Frequency Min 3X/week   Barriers to discharge         Co-evaluation               End of Session   Activity Tolerance: Patient tolerated treatment well Patient left: with call bell/phone within reach;Other (comment) (sitting EOB) Nurse Communication: Mobility status         Time: 3646-8032 PT Time Calculation (min) (ACUTE ONLY): 16 min   Charges:   PT Evaluation $Initial PT Evaluation Tier I: 1 Procedure     PT G Codes:        Mottinger, Tessie Fass 07/21/2015, 3:26 PM   07/21/2015  Donnella Sham, [place] [phone]  (pager)

## 2015-07-21 NOTE — Progress Notes (Signed)
[facility] [place]  Subjective  - HA now and vomited last night.  She thinks it was the pain medication.   Objective 112/57 75 99.8 F (37.7 C) (Oral) 17 92%  Intake/Output Summary (Last 24 hours) at 07/21/15 0715 Last data filed at 07/21/15 0630  Gross per 24 hour  Intake   3015 ml  Output   1055 ml  Net   1960 ml    Palpable DP pulses bilateral Right groin soft Herat RRR Lungs non labored breathing  Assessment/Planning: POD # 1repair of pseudoaneurysm of right proximal femoropopliteal bypass graft  She does not feel nauseous this am.  Pending tolerating PO's, voiding and ambulation she could possibly be discharged later today. I will order Ibuprofen for her HA, that is what she takes at home.  Laurence Slate [facility] [place] 07/21/2015 7:15 AM --  Laboratory Lab Results:  Recent Labs  07/20/15 1725 07/21/15 0423  WBC 13.6* 10.2  HGB 12.5 11.6*  HCT 37.1 34.2*  PLT 158 152   BMET  Recent Labs  07/18/15 1530 07/20/15 1725 07/21/15 0423  NA 140  --  140  K 3.6  --  3.6  CL 101  --  106  CO2 28  --  26  GLUCOSE 105*  --  99  BUN 11  --  7  CREATININE 0.88 0.80 0.91  CALCIUM 9.9  --  8.3*    COAG Lab Results  Component Value Date   INR 1.08 07/18/2015   INR 1.00 06/15/2012   No results found for: PTT

## 2015-07-21 NOTE — Telephone Encounter (Signed)
-----   Message from Gabriel Earing, [place] sent at 07/20/2015  2:01 PM EDT ----- S/p repair right femoral artery pseudoaneurysm 07/20/15.  F/u with Dr. Scot Dock in 2 weeks.  Thanks, Aldona Bar

## 2015-07-21 NOTE — Telephone Encounter (Signed)
LM for pt re appt, dpm °

## 2015-07-21 NOTE — Evaluation (Signed)
Occupational Therapy Evaluation Patient Details Name: Anna Barrera MRN: [id number] DOB: Jul 03, 1946 Today's Date: 07/21/2015    History of Present Illness This 69 y.o. female admitted for repair of pseudoaneurysm of Rt proximal femoropopliteal bypass graft.  PMH includes:  PVD, depression, COPD`   Clinical Impression   Pt admitted with above. She demonstrates the below listed deficits and will benefit from continued OT to maximize safety and independence with BADLs.  Pt presents to OT with generalized weakness and acute pain.  She requires min A for ADLs, and has good support at home.  Will follow acutely.   No follow up OT needed at discharge and pt has all needed DME.        Follow Up Recommendations  No OT follow up;Supervision/Assistance - 24 hour    Equipment Recommendations  None recommended by OT    Recommendations for Other Services       Precautions / Restrictions Precautions Precautions: Fall      Mobility Bed Mobility Overal bed mobility: Modified Independent                Transfers Overall transfer level: Needs assistance Equipment used: Rolling walker (2 wheeled) Transfers: Sit to/from [facility] Sit to Stand: Supervision Stand pivot transfers: Supervision            Balance Overall balance assessment: Needs assistance Sitting-balance support: Feet supported Sitting balance-Leahy Scale: Good     Standing balance support: No upper extremity supported Standing balance-Leahy Scale: Fair                              ADL Overall ADL's : Needs assistance/impaired Eating/Feeding: Independent   Grooming: Wash/dry hands;Wash/dry face;Oral care;Brushing hair;Min guard;Standing   Upper Body Bathing: Set up;Sitting   Lower Body Bathing: Minimal assistance;Sit to/from stand   Upper Body Dressing : Set up;Sitting   Lower Body Dressing: Minimal assistance;Sit to/from stand Lower Body Dressing Details  (indicate cue type and reason): unable to access Rt foot to don sock.  Able to simulate donning pants  Toilet Transfer: Min guard;RW;BSC   Toileting- Clothing Manipulation and Hygiene: Min guard;Sit to/from stand       Functional mobility during ADLs: Min guard;Rolling walker General ADL Comments: Pt is very motivated.  Pt reports her sons can help her don Rt sock if she is unable to do so at discharge      Vision     Perception     Praxis      Pertinent Vitals/Pain Pain Assessment: 0-10 Pain Score: 2  Pain Location: Rt LE  Pain Descriptors / Indicators: Grimacing Pain Intervention(s): Monitored during session     Hand Dominance Right   Extremity/Trunk Assessment Upper Extremity Assessment Upper Extremity Assessment: Overall WFL for tasks assessed   Lower Extremity Assessment Lower Extremity Assessment: Defer to PT evaluation   Cervical / Trunk Assessment Cervical / Trunk Assessment: Normal   Communication     Cognition Arousal/Alertness: Awake/alert Behavior During Therapy: WFL for tasks assessed/performed Overall Cognitive Status: Within Functional Limits for tasks assessed                     General Comments       Exercises       Shoulder Instructions      Home Living Family/patient expects to be discharged to:: Private residence Living Arrangements: Children Available Help at Discharge: Family;Available 24 hours/day Type of Home: Mobile home  Home Access: Stairs to enter Entrance Stairs-Number of Steps: 5 Entrance Stairs-Rails: Right;Left Home Layout: One level     Bathroom Shower/Tub: Tub/shower unit;Curtain Shower/tub characteristics: Architectural technologist: Standard Bathroom Accessibility: Yes How Accessible: Accessible via walker Home Equipment: [place] - 2 wheels;Bedside commode          Prior Functioning/Environment Level of Independence: Independent        Comments: Pt does not drive     OT Diagnosis: Generalized  weakness;Acute pain   OT Problem List: Decreased strength;Pain   OT Treatment/Interventions: Self-care/ADL training;DME and/or AE instruction;Therapeutic activities;Patient/family education;Balance training    OT Goals(Current goals can be found in the care plan section) Acute Rehab OT Goals Patient Stated Goal: to go home  OT Goal Formulation: With patient Time For Goal Achievement: 07/28/15 Potential to Achieve Goals: Good ADL Goals Pt Will Perform Grooming: with modified independence;standing Pt Will Perform Lower Body Bathing: with modified independence;sit to/from stand Pt Will Perform Lower Body Dressing: with modified independence;sit to/from stand Pt Will Transfer to Toilet: with modified independence;ambulating;regular height toilet;grab bars Pt Will Perform Toileting - Clothing Manipulation and hygiene: with modified independence;sit to/from stand Pt Will Perform Tub/Shower Transfer: Tub transfer;with supervision;ambulating;rolling walker  OT Frequency: Min 2X/week   Barriers to D/C:            Co-evaluation              End of Session Equipment Utilized During Treatment: Rolling walker Nurse Communication: Mobility status  Activity Tolerance: Patient tolerated treatment well Patient left: in bed;with call bell/phone within reach   Time: 1146-1206 OT Time Calculation (min): 20 min Charges:  OT General Charges $OT Visit: 1 Procedure OT Evaluation $Initial OT Evaluation Tier I: 1 Procedure G-Codes:    Lucille Passy M August 06, 2015, 12:28 PM

## 2015-07-21 NOTE — Progress Notes (Signed)
Report called, pt will transfer to [id number] via w/c with belongings.

## 2015-07-22 MED ORDER — HYDROCODONE-ACETAMINOPHEN 5-325 MG PO TABS: 1.0000 | ORAL_TABLET | ORAL | Status: DC | PRN

## 2015-07-22 NOTE — Progress Notes (Addendum)
[facility]  Subjective  - Doing well no problems.   Objective 144/64 91 99.9 F (37.7 C) (Oral) 18 94%  Intake/Output Summary (Last 24 hours) at 07/22/15 0736 Last data filed at 07/21/15 1900  Gross per 24 hour  Intake   1025 ml  Output    650 ml  Net    375 ml   ABI's Right 0.91, left 0.60  Palpable DP pulses bil. Incision clean and dry soft without hematoma Heart RRR Lungs non labored breathing  Assessment/Planning: POD #2 repair of pseudoaneurysm of right proximal femoropopliteal bypass graft  Disposition stable Heart  No more nausea, no v tach  Discharge home f/u with Dr. Scot Dock in 2 weeks  Theda Sers [place] 07/22/2015 7:36 AM --  Laboratory Lab Results:  Recent Labs  07/20/15 1725 07/21/15 0423  WBC 13.6* 10.2  HGB 12.5 11.6*  HCT 37.1 34.2*  PLT 158 152   BMET  Recent Labs  07/20/15 1725 07/21/15 0423 07/21/15 1832  NA  --  140  --   K  --  3.6 3.4*  CL  --  106  --   CO2  --  26  --   GLUCOSE  --  99  --   BUN  --  7  --   CREATININE 0.80 0.91  --   CALCIUM  --  8.3*  --     COAG Lab Results  Component Value Date   INR 1.08 07/18/2015   INR 1.00 06/15/2012   No results found for: PTT    Addendum  I have independently interviewed and examined the patient, and I agree with the physician assistant's findings.  Ok to D/C home as pain controlled with PO rx, ambulating, and tolerating diet  Adele Barthel, MD [facility] Office: [phone] Pager: [phone]  07/22/2015, 11:01 AM

## 2015-07-31 ENCOUNTER — Encounter: Payer: Self-pay | Admitting: Vascular Surgery

## 2015-08-02 ENCOUNTER — Encounter: Payer: Self-pay | Admitting: Vascular Surgery

## 2015-08-09 ENCOUNTER — Encounter: Payer: Self-pay | Admitting: Vascular Surgery

## 2015-08-11 ENCOUNTER — Encounter: Payer: Self-pay | Admitting: Vascular Surgery

## 2016-01-16 ENCOUNTER — Ambulatory Visit: Payer: Self-pay | Admitting: Primary Care

## 2016-01-30 ENCOUNTER — Ambulatory Visit (INDEPENDENT_AMBULATORY_CARE_PROVIDER_SITE_OTHER): Payer: Commercial Managed Care - HMO | Admitting: Primary Care

## 2016-01-30 ENCOUNTER — Encounter: Payer: Self-pay | Admitting: Primary Care

## 2016-01-30 VITALS — BP 142/84 | HR 108 | Temp 98.3°F | Ht 62.0 in | Wt 136.8 lb

## 2016-01-30 DIAGNOSIS — F32A Depression, unspecified: Secondary | ICD-10-CM

## 2016-01-30 DIAGNOSIS — Z720 Tobacco use: Secondary | ICD-10-CM

## 2016-01-30 DIAGNOSIS — E785 Hyperlipidemia, unspecified: Secondary | ICD-10-CM | POA: Diagnosis not present

## 2016-01-30 DIAGNOSIS — K458 Other specified abdominal hernia without obstruction or gangrene: Secondary | ICD-10-CM | POA: Diagnosis not present

## 2016-01-30 DIAGNOSIS — K469 Unspecified abdominal hernia without obstruction or gangrene: Secondary | ICD-10-CM | POA: Insufficient documentation

## 2016-01-30 DIAGNOSIS — F329 Major depressive disorder, single episode, unspecified: Secondary | ICD-10-CM | POA: Diagnosis not present

## 2016-01-30 DIAGNOSIS — R4589 Other symptoms and signs involving emotional state: Secondary | ICD-10-CM | POA: Insufficient documentation

## 2016-01-30 NOTE — Progress Notes (Signed)
Subjective:    Patient ID: Anna Barrera, female    DOB: [birth year], 70 y.o.   MRN: [id number]  HPI  Anna Barrera is a 70 year old female who presents today to establish care and discuss the problems mentioned below. Will obtain old records. She believes her last physical was summer of 2016.   1) Hyperlipidemia: Currently managed on atorvastatin 20 mg. She's unsure of her most recent lipid panel results and believes she's due for recheck later this summer.  2) Pseudoaneurysm: Completion of Right Proximal Femorpopliteal Bypass Graft in September 2016. No complications since. Completed by [facility]. She continues to smoke cigarettes.   3) Depression: No formal diagnosis of and has never been on medication. She endorses fatigue, lack of motivation, feeling depressed. These symptoms have been present for years. Strong FH of depression. Denies SI/HI. PHQ 9 score of 20 today. She is not interested in treatment today (therapy or medication), but will think about it.   4) Elevated Blood Pressure: History of in the past. Denies chest pain. She experiences headaches and some dizziness occasionally. She does not check her BP at home. BP stable in clinic today.  5) Hernia: First diagnosed in the 1990's. She's had 2 surgeries in the past with mesh currently present.. Ventral hernia to abdomen. She was told that a third surgery was too risky. She would like a second opinion and further evaluation of her hernia as she's noticed it increase in size. Denies pain. She was advised to stop smoking and has not done so.  6) Tobacco Abuse: Smokes cigarettes, 1 PPD, sometimes more, for the past 50+ years. She is not interested in quitting at this time even though she's aware of the dangers.  She's been told she has COPD in the past, currently not managed on inhalers. She endorses mild SOB and wheezing, and chronic cough at night.   Review of Systems  Constitutional: Negative for  fever and unexpected weight change.  Respiratory: Positive for shortness of breath and wheezing. Negative for cough.        Chronic cough at night.  Cardiovascular: Negative for chest pain.  Gastrointestinal: Negative for abdominal pain.       Ventral Hernia  Psychiatric/Behavioral: Negative for suicidal ideas.       See HPI       Past Medical History  Diagnosis Date  . Seasonal allergies   . Perforated bowel ([place])   . Complication of anesthesia     "hard time waking up"  . Peripheral vascular disease ([place])   . Depression   . Hay fever   . Constipation   . Anemia   . COPD (chronic obstructive pulmonary disease) ([place])   . Cancer ([place])     [place] -  Left upper chest  . Incisional hernia     Social History   Social History  . Marital Status: Single    Spouse Name: N/A  . Number of Children: N/A  . Years of Education: N/A   Occupational History  . Not on file.   Social History Main Topics  . Smoking status: Current Every Day Smoker -- 0.10 packs/day  . Smokeless tobacco: Never Used  . Alcohol Use: No  . Drug Use: No  . Sexual Activity: Not on file   Other Topics Concern  . Not on file   Social History Narrative   Single.   3 children, no grandchildren.   Retired. Once worked with elderly, cleaned  houses.       Past Surgical History  Procedure Laterality Date  . Hernia repair    . Colostomy    . Colostomy reversal     . Femoral-tibial bypass graft  06/16/2012    Procedure: BYPASS GRAFT FEMORAL-TIBIAL ARTERY;  Surgeon: Angelia Mould, MD;  Location: [facility];  Service: Vascular;  Laterality: Right;  . Intraoperative arteriogram  06/16/2012    Procedure: INTRA OPERATIVE ARTERIOGRAM;  Surgeon: Angelia Mould, MD;  Location: [facility];  Service: Vascular;  Laterality: Right;  . Lower extremity angiogram Left 06/10/2012    Procedure: LOWER EXTREMITY ANGIOGRAM;  Surgeon: Serafina Mitchell, MD;  Location[facility] C[facility] LAB;  Service: Cardiovascular;  Laterality: Left;    . False aneurysm repair Right 07/20/2015    Procedure: REPAIR RIGHT FEMORAL PSEUDOANEURYSM;  Surgeon: Angelia Mould, MD;  Location: [facility] OR;  Service: Vascular;  Laterality: Right;    Family History  Problem Relation Age of Onset  . Breast cancer Mother   . Cancer Mother   . Cancer Father   . Diabetes Brother     No Known Allergies  Current Outpatient Prescriptions on File Prior to Visit  Medication Sig Dispense Refill  . aspirin 325 MG EC tablet Take 325 mg by mouth daily as needed for pain.     Marland Kitchen atorvastatin (LIPITOR) 10 MG tablet Take 1 tablet (10 mg total) by mouth daily. 30 tablet 3  . Multiple Vitamin (MULTIVITAMIN WITH MINERALS) TABS tablet Take 1 tablet by mouth daily. One a Day     No current facility-administered medications on file prior to visit.    BP 142/84 mmHg  Pulse 108  Temp(Src) 98.3 F (36.8 C) (Oral)  Ht 5\' 2"  (1.575 m)  Wt 136 lb 12.8 oz (62.052 kg)  BMI 25.01 kg/m2  SpO2 98%    Objective:   Physical Exam  Constitutional: She appears well-nourished.  Cardiovascular: Normal rate and regular rhythm.   Pulmonary/Chest: Effort normal and breath sounds normal. She has no wheezes. She has no rales.  Abdominal: Soft. Bowel sounds are normal. There is no tenderness.  Large hernia present to mid abdomen. Non tender.  Skin: Skin is warm and dry.          Assessment & Plan:

## 2016-01-30 NOTE — Patient Instructions (Signed)
Stop smoking. Please notify me if you would like assistance with tobacco cessation.  Continue atorvastatin 20 mg tablets.  I highly recommend treatment for your depression, please notify me when you are interested.  Please schedule a physical with me within the next 3-6 months. You may also schedule a lab only appointment 3-4 days prior. We will discuss your lab results in detail during your physical.  It was a pleasure to meet you today! Please don't hesitate to call me with any questions. Welcome to [facility]!

## 2016-01-30 NOTE — Assessment & Plan Note (Signed)
Managed on atorvastatin 20 mg and aspirin 325 mg. Likely cardioprotective as she denies history of hyperlipidemia. Will obtain lipid panel this summer during physical.

## 2016-01-30 NOTE — Assessment & Plan Note (Addendum)
Stats post 2 hernia repair surgeries, last one being in 2016. She would like another further consultation regarding current hernia as the hernia has become larger. Will place referral per patient's request, however, suspect she will be a poor candidate.

## 2016-01-30 NOTE — Assessment & Plan Note (Signed)
Smokes 1 PPD for the past 50+ years. Discussed importance of cessation; she is not ready. Offered my assistance when ready. Will continue to monitor.

## 2016-01-30 NOTE — Assessment & Plan Note (Signed)
Endorses long history of, no formal diagnosis. Never managed on meds or therapy. PHQ 9 score of 20 today, denies SI/HI. Highly suggested treatment today, she declines and will consider.

## 2016-01-30 NOTE — Progress Notes (Signed)
Pre visit review using our clinic review tool, if applicable. No additional management support is needed unless otherwise documented below in the visit note. 

## 2016-02-05 ENCOUNTER — Encounter: Payer: Self-pay | Admitting: Primary Care

## 2016-10-17 ENCOUNTER — Telehealth: Payer: Self-pay

## 2016-10-17 NOTE — Telephone Encounter (Signed)
Attempted to reach pt to schedule 2017 AWV + CPE but VM is full.

## 2016-12-10 ENCOUNTER — Telehealth: Payer: Self-pay

## 2016-12-10 NOTE — Telephone Encounter (Signed)
Attempted to reach pt to schedule 2018 AWV + CPE. VM is full.

## 2016-12-27 ENCOUNTER — Telehealth: Payer: Self-pay | Admitting: Primary Care

## 2016-12-27 NOTE — Telephone Encounter (Signed)
Attempted to call pt. VM full. Needs to schedule AWV + labs with Lesia and CPE with PCP. °

## 2017-01-28 NOTE — Telephone Encounter (Signed)
Attempted to call pt. VM full. Needs to schedule AWV + labs with Katha Cabal and CPE with PCP.

## 2018-11-23 ENCOUNTER — Encounter: Payer: Self-pay | Admitting: Family Medicine

## 2018-11-23 ENCOUNTER — Ambulatory Visit (INDEPENDENT_AMBULATORY_CARE_PROVIDER_SITE_OTHER): Payer: Medicare HMO | Admitting: Family Medicine

## 2018-11-23 VITALS — BP 158/93 | HR 119 | Temp 99.2°F | Ht 62.0 in | Wt 140.6 lb

## 2018-11-23 DIAGNOSIS — J42 Unspecified chronic bronchitis: Secondary | ICD-10-CM | POA: Insufficient documentation

## 2018-11-23 DIAGNOSIS — Z7689 Persons encountering health services in other specified circumstances: Secondary | ICD-10-CM

## 2018-11-23 DIAGNOSIS — Z803 Family history of malignant neoplasm of breast: Secondary | ICD-10-CM | POA: Diagnosis not present

## 2018-11-23 DIAGNOSIS — E785 Hyperlipidemia, unspecified: Secondary | ICD-10-CM

## 2018-11-23 DIAGNOSIS — R030 Elevated blood-pressure reading, without diagnosis of hypertension: Secondary | ICD-10-CM | POA: Diagnosis not present

## 2018-11-23 DIAGNOSIS — Z9119 Patient's noncompliance with other medical treatment and regimen: Secondary | ICD-10-CM | POA: Insufficient documentation

## 2018-11-23 DIAGNOSIS — Z800 Family history of malignant neoplasm of digestive organs: Secondary | ICD-10-CM | POA: Diagnosis not present

## 2018-11-23 DIAGNOSIS — Z91199 Patient's noncompliance with other medical treatment and regimen due to unspecified reason: Secondary | ICD-10-CM

## 2018-11-23 DIAGNOSIS — Z720 Tobacco use: Secondary | ICD-10-CM | POA: Diagnosis not present

## 2018-11-23 DIAGNOSIS — I739 Peripheral vascular disease, unspecified: Secondary | ICD-10-CM | POA: Diagnosis not present

## 2018-11-23 NOTE — Progress Notes (Signed)
New patient office visit note:  Impression and Recommendations:    1. Encounter to establish care with new doctor   2. PVD (peripheral vascular disease) ([place])   3. PAD (peripheral artery disease) ([place])   4. Chronic bronchitis, unspecified chronic bronchitis type ([place])   5. Hyperlipidemia, unspecified hyperlipidemia type   6. Tobacco abuse-  greater than 100 Pk Yr Hx   7. History of noncompliance with medical treatment, presenting hazards to health   8. Elevated blood pressure reading   9. Family history of breast cancer-first-degree relative   10. Family history of stomach cancer     - Need for lab work & Wellness Assessment in near future.  1. Encounter to Establish Care - Extensive discussion held with patient regarding establishing as a new patient.  Discussed policies and practices here at the clinic, and answered all questions about care team and health management during appointment.  - Discussed need for patient to continue to obtain management and screenings with all established specialists.  Educated patient at length about the critical importance of keeping health maintenance up to date.  - Participated in lengthy conversation and all questions were answered.  2. Blood Pressure - Elevated on Intake - Discussed importance of keeping blood pressure controlled.  - Lifestyle changes such as dash diet and engaging in a regular exercise program discussed with patient.  Educational handouts provided.  - Patient does not have a blood pressure cuff at home. - Encouraged patient to order one and begin ambulatory BP monitoring. - Check your blood pressure 3-4 times per week, random times, after sitting quietly. - Keep log and bring in next OV.  - Will continue to monitor.   Education and routine counseling performed. Handouts provided.   Return for 2) 4-6 wks- BP, go over FBW which she will get 5 d prior.  Please see AVS handed out to patient at the end of our  visit for further patient instructions/ counseling done pertaining to today's office visit.    Note:  This document was prepared using Dragon voice recognition software and may include unintentional dictation errors.  This document serves as a record of services personally performed by Mellody Dance, DO. It was created on her behalf by Toni Amend, a trained medical scribe. The creation of this record is based on the scribe's personal observations and the provider's statements to them.   I have reviewed the above medical documentation for accuracy and completeness and I concur.  Mellody Dance, DO 11/23/2018 7:03 PM       ---------------------------------------------------------------------------------------------------------------------------------    Subjective:    Chief complaint:   Chief Complaint  Patient presents with  . Establish Care     HPI: Anna Barrera is a pleasant 73 y.o. female who presents to [place] [facility] today to review their medical history with me and establish care.   I asked the patient to review their chronic problem list with me to ensure everything was updated and accurate.    All recent office visits with other providers, any medical records that patient brought in etc  - I reviewed today.     We asked pt to get [place] their medical records from [facility] providers/ specialists that they had seen within the past 3-5 years- if they are in private practice and/or do not work for [employer], [facility], [place], [place] or [employer] owned practice.  Told them to call their specialists to clarify this  if they are not sure.    The PCP she used to go to is too far away.  Was seeing Alma Friendly at [facility], NP.  Was last seen by her a year ago.  Confirms that she doesn't go to the doctor like she's supposed to.  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 Retired in her late 10's.  Lives with her two adult children. No husband or significant other.  Tobacco Use - Tobacco Abuse Current every day smoker, 2 ppd, 60+ pack-years. Has smoked since junior high, about age 89, for about 17 years. Smoking 2 packs per day due to her nerves.  Alcohol Use No.  2 cups of coffee in the morning Sweet tea all day long.  Family History Both of her kids are bipolar.  Mother had breast cancer.  Was diagnosed in her late 70's, died at age 63. Her father died when he was age 21, stomach cancer.  He drank on the weekends. Was diagnosed with stomach cancer when he was in his 59's.  Her mom and dad had depression.   Surgical History Past Surgical History:  Procedure Laterality Date  . COLOSTOMY    . colostomy reversal     . FALSE ANEURYSM REPAIR Right 07/20/2015   Procedure: REPAIR RIGHT FEMORAL PSEUDOANEURYSM;  Surgeon: Angelia Mould, MD;  Location: [place];  Service: Vascular;  Laterality: Right;  . FEMORAL-TIBIAL BYPASS GRAFT  06/16/2012   Procedure: BYPASS GRAFT FEMORAL-TIBIAL ARTERY;  Surgeon: Angelia Mould, MD;  Location: [place];  Service: Vascular;  Laterality: Right;  . HERNIA REPAIR    . INTRAOPERATIVE ARTERIOGRAM  06/16/2012   Procedure: INTRA OPERATIVE ARTERIOGRAM;  Surgeon: Angelia Mould, MD;  Location: [place];  Service: Vascular;  Laterality: Right;  . LOWER EXTREMITY ANGIOGRAM Left 06/10/2012   Procedure: LOWER EXTREMITY ANGIOGRAM;  Surgeon: Serafina Mitchell, MD;  Location: [facility] CATH LAB;  Service: Cardiovascular;  Laterality: Left;    Past Medical History Says "I think I need someone like you to push me, because I don't like to go to doctors."  Has never had a heart attack or stroke.  - Prediabetes Has been told she has prediabetes "long ago."  - Blood Pressure Never been told she has high blood pressure in the past.  - Hyperlipidemia Took Lipitor "the last time I had surgery."  -  Depression Longstanding history of this. Confirms family history, mom and dad had depression. Never took any medication to treat this in the past.  - Peripheral Arterial Disease, Peripheral Vascular Disease Blockage in both legs Veins repaired in her legs.  Sees Dr. Scot Dock.  - History of Hernia Surgeries Had two hernia surgeries, and now has another hernia "but they won't do anything about it because the doctor said it was too risky."  Follows up with GI doctor.  - History of Perforated Colon Had a perforated colon and wore a colostomy bag.  - COPD Did not see a pulmonologist or lung specialist; never needed inhalers in the past.   Wt Readings from Last 3 Encounters:  11/23/18 140 lb 9.6 oz (63.8 kg)  01/30/16 136 lb 12.8 oz (62.1 kg)  07/20/15 138 lb 0.1 oz (62.6 kg)   BP Readings from Last 3 Encounters:  11/23/18 (!) 158/93  01/30/16 (!) 142/84  07/22/15 (!) 128/54   Pulse Readings from Last 3 Encounters:  11/23/18 (!) 119  01/30/16 (!) 108  07/22/15 84   BMI Readings from Last 3 Encounters:  11/23/18 25.72 kg/m  01/30/16 25.02 kg/m  07/20/15 25.24 kg/m    Patient Care Team    Relationship Specialty Notifications Start End  Mellody Dance, DO PCP - General Family Medicine  11/23/18   Arta Silence, MD Consulting Physician Gastroenterology  06/21/15   Pleas Koch, NP Nurse Practitioner Internal Medicine  11/23/18 11/23/18    Patient Active Problem List   Diagnosis Date Noted  . Hyperlipidemia 01/30/2016    Priority: High  . Tobacco abuse-  greater than 100 Pk Yr Hx 06/10/2012    Priority: High  . PVD (peripheral vascular disease) ([place]) 11/23/2018    Priority: Medium  . PAD (peripheral artery disease) ([place]) 11/23/2018    Priority: Medium  . Depressed mood 01/30/2016    Priority: Medium  . Chronic bronchitis ([place]) 11/23/2018  . Elevated blood pressure reading 11/23/2018  . Family history of breast cancer-first-degree relative 11/23/2018  .  Family history of stomach cancer 11/23/2018  . History of noncompliance with medical treatment, presenting hazards to health 11/23/2018  . Hernia of abdominal cavity 01/30/2016  . Femoral artery pseudo-aneurysm, right ([place]) 07/20/2015  . h/o Arterial insufficiency with ischemic ulcer ([place]) 06/10/2012       As reported by pt:  Past Medical History:  Diagnosis Date  . Anemia   . Basal cell carcinoma   . Cancer ([place])    BCC -  Left upper chest  . Complication of anesthesia    "hard time waking up"  . Constipation   . COPD (chronic obstructive pulmonary disease) ([place])   . Depression   . Incisional hernia   . Perforated bowel ([place])   . Peripheral vascular disease ([place])   . Pseudoaneurysm of femoral artery ([facility])    right  . Seasonal allergies      Past Surgical History:  Procedure Laterality Date  . COLOSTOMY    . colostomy reversal     . FALSE ANEURYSM REPAIR Right 07/20/2015   Procedure: REPAIR RIGHT FEMORAL PSEUDOANEURYSM;  Surgeon: Angelia Mould, MD;  Location: [place];  Service: Vascular;  Laterality: Right;  . FEMORAL-TIBIAL BYPASS GRAFT  06/16/2012   Procedure: BYPASS GRAFT FEMORAL-TIBIAL ARTERY;  Surgeon: Angelia Mould, MD;  Location: [place];  Service: Vascular;  Laterality: Right;  . HERNIA REPAIR    . INTRAOPERATIVE ARTERIOGRAM  06/16/2012   Procedure: INTRA OPERATIVE ARTERIOGRAM;  Surgeon: Angelia Mould, MD;  Location: [place];  Service: Vascular;  Laterality: Right;  . LOWER EXTREMITY ANGIOGRAM Left 06/10/2012   Procedure: LOWER EXTREMITY ANGIOGRAM;  Surgeon: Serafina Mitchell, MD;  Location: [facility] CATH LAB;  Service: Cardiovascular;  Laterality: Left;     Family History  Problem Relation Age of Onset  . Breast cancer Mother   . Cancer Mother 23       breast, late 46's, died at age 17  . Depression Mother   . Cancer Father 28       stomach, died at age 50  . Depression Father   . Diabetes Brother   . Bipolar disorder Brother   . Bipolar  disorder Sister      Social History   Substance and Sexual Activity  Drug Use No     Social History   Substance and Sexual Activity  Alcohol Use No     Social History   Tobacco Use  Smoking Status Current Every Day Smoker  . Packs/day: 2.00  . Years: 40.00  .  Pack years: 80.00  Smokeless Tobacco Never Used     No outpatient medications have been marked as taking for the 11/23/18 encounter (Office Visit) with Mellody Dance, DO.    Allergies: Patient has no known allergies.   Review of Systems  Constitutional: Negative for chills, diaphoresis, fever, malaise/fatigue and weight loss.       Reports chronic fevers, sweats, weakness, unspecified.  HENT: Negative for congestion, sore throat and tinnitus.   Eyes: Negative for blurred vision, double vision and photophobia.  Respiratory: Positive for cough (chronic, COPD) and wheezing (chronic, COPD).   Cardiovascular: Negative for chest pain and palpitations.  Gastrointestinal: Negative for blood in stool, diarrhea, nausea and vomiting.  Genitourinary: Negative for dysuria, frequency and urgency.  Musculoskeletal: Positive for joint pain (chronic) and myalgias (chronic).  Skin: Negative for itching and rash.       New mole or skin change, unspecified.  Neurological: Negative for dizziness, focal weakness, weakness and headaches.  Endo/Heme/Allergies: Positive for environmental allergies (hay fever, chronic). Negative for polydipsia. Does not bruise/bleed easily.  Psychiatric/Behavioral: Positive for depression (chronic) and memory loss (chronic, "fair"). The patient has insomnia (chronic). The patient is not nervous/anxious.         Objective:   Blood pressure (!) 158/93, pulse (!) 119, temperature 99.2 F (37.3 C), height 5\' 2"  (1.575 m), weight 140 lb 9.6 oz (63.8 kg), SpO2 95 %. Body mass index is 25.72 kg/m. General: Well Developed, well nourished, and in no acute distress.  Neuro: Alert and oriented x3,  extra-ocular muscles intact, sensation grossly intact.  HEENT:[place]/AT, PERRLA, neck supple, No carotid bruits Skin: no gross rashes  Cardiac: Regular rate and rhythm Respiratory: Essentially clear to auscultation bilaterally. Not using accessory muscles, speaking in full sentences.  Abdominal: not grossly distended Musculoskeletal: Ambulates w/o diff, FROM * 4 ext.  Vasc: less 2 sec cap RF, warm and pink  Psych:  No HI/SI, judgement and insight good, Euthymic mood. Full Affect.    No results found for this or any previous visit (from the past 2160 hour(s)).

## 2018-11-23 NOTE — Patient Instructions (Addendum)
Melissa, please place full set FBW future- and give pt handout for BP log. thnx      [employer] is a good brand for arm blood pressure cuff.  Please look into this brand when ordering your blood pressure cuff.  Your goal blood pressure should be 140/90 or less on a regular basis-please bring in your blood pressure cuff next office visit with me in approximately 6 weeks.       Hypertension Hypertension, commonly called high blood pressure, is when the force of blood pumping through the arteries is too strong. The arteries are the blood vessels that carry blood from the heart throughout the body. Hypertension forces the heart to work harder to pump blood and may cause arteries to become narrow or stiff. Having untreated or uncontrolled hypertension can cause heart attacks, strokes, kidney disease, and other problems. A blood pressure reading consists of a higher number over a lower number. Ideally, your blood pressure should be below 120/80. The first ("top") number is called the systolic pressure. It is a measure of the pressure in your arteries as your heart beats. The second ("bottom") number is called the diastolic pressure. It is a measure of the pressure in your arteries as the heart relaxes. What are the causes? The cause of this condition is not known. What increases the risk? Some risk factors for high blood pressure are under your control. Others are not. Factors you can change  Smoking.  Having type 2 diabetes mellitus, high cholesterol, or both.  Not getting enough exercise or physical activity.  Being overweight.  Having too much fat, sugar, calories, or salt (sodium) in your diet.  Drinking too much alcohol. Factors that are difficult or impossible to change  Having chronic kidney disease.  Having a family history of high blood pressure.  Age. Risk increases with age.  Race. You may be at higher risk if you are African-American.  Gender. Men are at higher risk than  women before age 89. After age 78, women are at higher risk than men.  Having obstructive sleep apnea.  Stress. What are the signs or symptoms? Extremely high blood pressure (hypertensive crisis) may cause:  Headache.  Anxiety.  Shortness of breath.  Nosebleed.  Nausea and vomiting.  Severe chest pain.  Jerky movements you cannot control (seizures).  How is this diagnosed? This condition is diagnosed by measuring your blood pressure while you are seated, with your arm resting on a surface. The cuff of the blood pressure monitor will be placed directly against the skin of your upper arm at the level of your heart. It should be measured at least twice using the same arm. Certain conditions can cause a difference in blood pressure between your right and left arms. Certain factors can cause blood pressure readings to be lower or higher than normal (elevated) for a short period of time:  When your blood pressure is higher when you are in a health care provider's office than when you are at home, this is called white coat hypertension. Most people with this condition do not need medicines.  When your blood pressure is higher at home than when you are in a health care provider's office, this is called masked hypertension. Most people with this condition may need medicines to control blood pressure.  If you have a high blood pressure reading during one visit or you have normal blood pressure with other risk factors:  You may be asked to return on a different day to  have your blood pressure checked again.  You may be asked to monitor your blood pressure at home for 1 week or longer.  If you are diagnosed with hypertension, you may have other blood or imaging tests to help your health care provider understand your overall risk for other conditions. How is this treated? This condition is treated by making healthy lifestyle changes, such as eating healthy foods, exercising more, and  reducing your alcohol intake. Your health care provider may prescribe medicine if lifestyle changes are not enough to get your blood pressure under control, and if:  Your systolic blood pressure is above 130.  Your diastolic blood pressure is above 80.  Your personal target blood pressure may vary depending on your medical conditions, your age, and other factors. Follow these instructions at home: Eating and drinking  Eat a diet that is high in fiber and potassium, and low in sodium, added sugar, and fat. An example eating plan is called the DASH (Dietary Approaches to Stop Hypertension) diet. To eat this way: ? Eat plenty of fresh fruits and vegetables. Try to fill half of your plate at each meal with fruits and vegetables. ? Eat whole grains, such as whole wheat pasta, brown rice, or whole grain bread. Fill about one quarter of your plate with whole grains. ? Eat or drink low-fat dairy products, such as skim milk or low-fat yogurt. ? Avoid fatty cuts of meat, processed or cured meats, and poultry with skin. Fill about one quarter of your plate with lean proteins, such as fish, chicken without skin, beans, eggs, and tofu. ? Avoid premade and processed foods. These tend to be higher in sodium, added sugar, and fat.  Reduce your daily sodium intake. Most people with hypertension should eat less than 1,500 mg of sodium a day.  Limit alcohol intake to no more than 1 drink a day for nonpregnant women and 2 drinks a day for men. One drink equals 12 oz of beer, 5 oz of wine, or 1 oz of hard liquor. Lifestyle  Work with your health care provider to maintain a healthy body weight or to lose weight. Ask what an ideal weight is for you.  Get at least 30 minutes of exercise that causes your heart to beat faster (aerobic exercise) most days of the week. Activities may include walking, swimming, or biking.  Include exercise to strengthen your muscles (resistance exercise), such as pilates or lifting  weights, as part of your weekly exercise routine. Try to do these types of exercises for 30 minutes at least 3 days a week.  Do not use any products that contain nicotine or tobacco, such as cigarettes and e-cigarettes. If you need help quitting, ask your health care provider.  Monitor your blood pressure at home as told by your health care provider.  Keep all follow-up visits as told by your health care provider. This is important. Medicines  Take over-the-counter and prescription medicines only as told by your health care provider. Follow directions carefully. Blood pressure medicines must be taken as prescribed.  Do not skip doses of blood pressure medicine. Doing this puts you at risk for problems and can make the medicine less effective.  Ask your health care provider about side effects or reactions to medicines that you should watch for. Contact a health care provider if:  You think you are having a reaction to a medicine you are taking.  You have headaches that keep coming back (recurring).  You feel dizzy.  You have swelling in your ankles.  You have trouble with your vision. Get help right away if:  You develop a severe headache or confusion.  You have unusual weakness or numbness.  You feel faint.  You have severe pain in your chest or abdomen.  You vomit repeatedly.  You have trouble breathing. Summary  Hypertension is when the force of blood pumping through your arteries is too strong. If this condition is not controlled, it may put you at risk for serious complications.  Your personal target blood pressure may vary depending on your medical conditions, your age, and other factors. For most people, a normal blood pressure is less than 120/80.  Hypertension is treated with lifestyle changes, medicines, or a combination of both. Lifestyle changes include weight loss, eating a healthy, low-sodium diet, exercising more, and limiting alcohol. This information is  not intended to replace advice given to you by your health care provider. Make sure you discuss any questions you have with your health care provider. Document Released: 10/14/2005 Document Revised: 09/11/2016 Document Reviewed: 09/11/2016 Elsevier Interactive Patient Education  2018 [employer].    How to Take Your Blood Pressure   Blood pressure is a measurement of how strongly your blood is pressing against the walls of your arteries. Arteries are blood vessels that carry blood from your heart throughout your body. Your health care provider takes your blood pressure at each office visit. You can also take your own blood pressure at home with a blood pressure machine. You may need to take your own blood pressure:  To confirm a diagnosis of high blood pressure (hypertension).  To monitor your blood pressure over time.  To make sure your blood pressure medicine is working.  Supplies needed: To take your blood pressure, you will need a blood pressure machine. You can buy a blood pressure machine, or blood pressure monitor, at most drugstores or online. There are several types of home blood pressure monitors. When choosing one, consider the following:  Choose a monitor that has an arm cuff.  Choose a monitor that wraps snugly around your upper arm. You should be able to fit only one finger between your arm and the cuff.  Do not choose a monitor that measures your blood pressure from your wrist or finger.  Your health care provider can suggest a reliable monitor that will meet your needs. How to prepare To get the most accurate reading, avoid the following for 30 minutes before you check your blood pressure:  Drinking caffeine.  Drinking alcohol.  Eating.  Smoking.  Exercising.  Five minutes before you check your blood pressure:  Empty your bladder.  Sit quietly without talking in a dining chair, rather than in a soft couch or armchair.  How to take your blood  pressure To check your blood pressure, follow the instructions in the manual that came with your blood pressure monitor. If you have a digital blood pressure monitor, the instructions may be as follows: 1. Sit up straight. 2. Place your feet on the floor. Do not cross your ankles or legs. 3. Rest your left arm at the level of your heart on a table or desk or on the arm of a chair. 4. Pull up your shirt sleeve. 5. Wrap the blood pressure cuff around the upper part of your left arm, 1 inch (2.5 cm) above your elbow. It is best to wrap the cuff around bare skin. 6. Fit the cuff snugly around your arm. You should  be able to place only one finger between the cuff and your arm. 7. Position the cord inside the groove of your elbow. 8. Press the power button. 9. Sit quietly while the cuff inflates and deflates. 10. Read the digital reading on the monitor screen and write it down (record it). 11. Wait 2-3 minutes, then repeat the steps, starting at step 1.  What does my blood pressure reading mean? A blood pressure reading consists of a higher number over a lower number. Ideally, your blood pressure should be below 120/80. The first ("top") number is called the systolic pressure. It is a measure of the pressure in your arteries as your heart beats. The second ("bottom") number is called the diastolic pressure. It is a measure of the pressure in your arteries as the heart relaxes. Blood pressure is classified into four stages. The following are the stages for adults who do not have a short-term serious illness or a chronic condition. Systolic pressure and diastolic pressure are measured in a unit called mm Hg. Normal  Systolic pressure: below 960.  Diastolic pressure: below 80. Elevated  Systolic pressure: 454-098.  Diastolic pressure: below 80. Hypertension stage 1  Systolic pressure: 119-147.  Diastolic pressure: 82-95. Hypertension stage 2  Systolic pressure: 621 or above.  Diastolic  pressure: 90 or above. You can have prehypertension or hypertension even if only the systolic or only the diastolic number in your reading is higher than normal. Follow these instructions at home:  Check your blood pressure as often as recommended by your health care provider.  Take your monitor to the next appointment with your health care provider to make sure: ? That you are using it correctly. ? That it provides accurate readings.  Be sure you understand what your goal blood pressure numbers are.  Tell your health care provider if you are having any side effects from blood pressure medicine. Contact a health care provider if:  Your blood pressure is consistently high. Get help right away if:  Your systolic blood pressure is higher than 180.  Your diastolic blood pressure is higher than 110. This information is not intended to replace advice given to you by your health care provider. Make sure you discuss any questions you have with your health care provider. Document Released: 03/22/2016 Document Revised: 06/04/2016 Document Reviewed: 03/22/2016 Elsevier Interactive Patient Education  [employer].

## 2018-12-22 ENCOUNTER — Other Ambulatory Visit: Payer: Medicare HMO

## 2018-12-29 ENCOUNTER — Ambulatory Visit: Payer: Medicare HMO | Admitting: Family Medicine

## 2023-06-01 ENCOUNTER — Emergency Department (HOSPITAL_COMMUNITY)
Admission: EM | Admit: 2023-06-01 | Discharge: 2023-06-01 | Disposition: A | Discharge status: Home / Self Care | Payer: Medicare HMO | Source: Home / Self Care | Attending: Emergency Medicine | Admitting: Emergency Medicine

## 2023-06-01 ENCOUNTER — Emergency Department (HOSPITAL_COMMUNITY): Payer: Medicare HMO

## 2023-06-01 ENCOUNTER — Other Ambulatory Visit: Payer: Self-pay

## 2023-06-01 DIAGNOSIS — R42 Dizziness and giddiness: Secondary | ICD-10-CM | POA: Diagnosis not present

## 2023-06-01 DIAGNOSIS — J449 Chronic obstructive pulmonary disease, unspecified: Secondary | ICD-10-CM | POA: Diagnosis not present

## 2023-06-01 DIAGNOSIS — M25552 Pain in left hip: Secondary | ICD-10-CM | POA: Diagnosis not present

## 2023-06-01 DIAGNOSIS — R0602 Shortness of breath: Secondary | ICD-10-CM | POA: Diagnosis not present

## 2023-06-01 DIAGNOSIS — R140 Abdominal distension (gaseous): Secondary | ICD-10-CM | POA: Insufficient documentation

## 2023-06-01 DIAGNOSIS — I10 Essential (primary) hypertension: Secondary | ICD-10-CM | POA: Diagnosis not present

## 2023-06-01 DIAGNOSIS — R9389 Abnormal findings on diagnostic imaging of other specified body structures: Secondary | ICD-10-CM | POA: Diagnosis not present

## 2023-06-01 DIAGNOSIS — R509 Fever, unspecified: Secondary | ICD-10-CM | POA: Diagnosis not present

## 2023-06-01 DIAGNOSIS — R457 State of emotional shock and stress, unspecified: Secondary | ICD-10-CM | POA: Diagnosis not present

## 2023-06-01 LAB — COMPREHENSIVE METABOLIC PANEL
ALT: 23 U/L (ref 0–44)
AST: 23 U/L (ref 15–41)
Albumin: 4.1 g/dL (ref 3.5–5.0)
Alkaline Phosphatase: 76 U/L (ref 38–126)
Anion gap: 13 (ref 5–15)
BUN: 21 mg/dL (ref 8–23)
CO2: 23 mmol/L (ref 22–32)
Calcium: 8.8 mg/dL — ABNORMAL LOW (ref 8.9–10.3)
Chloride: 100 mmol/L (ref 98–111)
Creatinine, Ser: 1.02 mg/dL — ABNORMAL HIGH (ref 0.44–1.00)
GFR, Estimated: 57 mL/min — ABNORMAL LOW (ref >60)
Glucose, Bld: 108 mg/dL — ABNORMAL HIGH (ref 70–99)
Potassium: 4 mmol/L (ref 3.5–5.1)
Sodium: 136 mmol/L (ref 135–145)
Total Bilirubin: 0.8 mg/dL (ref 0.3–1.2)
Total Protein: 7.9 g/dL (ref 6.5–8.1)

## 2023-06-01 LAB — CBC WITH DIFFERENTIAL/PLATELET
Abs Immature Granulocytes: 0.04 10*3/uL (ref 0.00–0.07)
Basophils Absolute: 0.1 10*3/uL (ref 0.0–0.1)
Basophils Relative: 1 %
Eosinophils Absolute: 0 10*3/uL (ref 0.0–0.5)
Eosinophils Relative: 0 %
HCT: 41.4 % (ref 36.0–46.0)
Hemoglobin: 14.1 g/dL (ref 12.0–15.0)
Immature Granulocytes: 0 %
Lymphocytes Relative: 13 %
Lymphs Abs: 1.3 10*3/uL (ref 0.7–4.0)
MCH: 31.5 pg (ref 26.0–34.0)
MCHC: 34.1 g/dL (ref 30.0–36.0)
MCV: 92.6 fL (ref 80.0–100.0)
Monocytes Absolute: 0.6 10*3/uL (ref 0.1–1.0)
Monocytes Relative: 6 %
Neutro Abs: 8 10*3/uL — ABNORMAL HIGH (ref 1.7–7.7)
Neutrophils Relative %: 80 %
Platelets: 197 10*3/uL (ref 150–400)
RBC: 4.47 MIL/uL (ref 3.87–5.11)
RDW: 12.7 % (ref 11.5–15.5)
WBC: 10 10*3/uL (ref 4.0–10.5)
nRBC: 0 % (ref 0.0–0.2)

## 2023-06-01 MED ORDER — HYDRALAZINE HCL 20 MG/ML IJ SOLN: 10.0000 mg | Freq: Once | INTRAMUSCULAR | Status: DC

## 2023-06-01 MED ORDER — AMLODIPINE BESYLATE 5 MG PO TABS: 5.0000 mg | ORAL_TABLET | Freq: Every day | ORAL | 0 refills | Status: AC

## 2023-06-01 MED ORDER — LACTATED RINGERS IV SOLN: INTRAVENOUS | Status: DC

## 2023-06-01 MED ORDER — AMLODIPINE BESYLATE 5 MG PO TABS: 5.0000 mg | ORAL_TABLET | Freq: Every day | ORAL | 0 refills | Status: DC

## 2023-06-01 MED ORDER — METHOCARBAMOL 500 MG PO TABS: 500.0000 mg | ORAL_TABLET | Freq: Two times a day (BID) | ORAL | 0 refills | Status: AC

## 2023-06-01 MED ORDER — METHOCARBAMOL 500 MG PO TABS: 500.0000 mg | ORAL_TABLET | Freq: Two times a day (BID) | ORAL | 0 refills | Status: DC

## 2023-06-01 NOTE — ED Notes (Signed)
Dr. Freida Busman made aware of patient's systolic BP in the 200's. Manuel BP checked and it was 152/90. Dr. Freida Busman made aware. Patient currently asymptomatic.

## 2023-06-01 NOTE — Discharge Instructions (Addendum)
Call the orthopedic surgeon to schedule a follow-up appointment for your hip pain.

## 2023-06-01 NOTE — ED Provider Notes (Signed)
[place] [facility]PART[facility] AT [facility] Provider Note   CSN: [id number] Arrival date & time: 06/01/23  1146     History  Chief Complaint  Patient presents with   Hip Pain    LEFT    Anna Barrera is a 77 y.o. female.  77 year old female presents with left hip pain x 1 month.  Patient states that she occasionally does use a cane.  Denies any history of trauma.  Patient states that the pain is sharp and worse with movement and certain positions.  Denies any urinary symptoms.  Notes that she does not take any regular medications and has not seen a doctor for several years.  Has a history of abdominal wall hernia and this is unchanged.  Denies any nausea or vomiting.  No treatment use prior to arrival.  [facility] and was able to ambulate to the stretcher.       Home Medications Prior to Admission medications   Not on File      Allergies    Patient has no known allergies.    Review of Systems   Review of Systems  All other systems reviewed and are negative.   Physical Exam Updated Vital Signs BP (!) 188/116   Pulse 88   Resp (!) 22   Ht 1.575 m (5\' 2" )   Wt 63.5 kg   SpO2 100%   BMI 25.61 kg/m  Physical Exam Vitals and nursing note reviewed.  Constitutional:      General: She is not in acute distress.    Appearance: Normal appearance. She is well-developed. She is not toxic-appearing.  HENT:     Head: Normocephalic and atraumatic.  Eyes:     General: Lids are normal.     Conjunctiva/sclera: Conjunctivae normal.     Pupils: Pupils are equal, round, and reactive to light.  Neck:     Thyroid: No thyroid mass.     Trachea: No tracheal deviation.  Cardiovascular:     Rate and Rhythm: Normal rate and regular rhythm.     Heart sounds: Normal heart sounds. No murmur heard.    No gallop.  Pulmonary:     Effort: Pulmonary effort is normal. No respiratory distress.     Breath sounds: Normal breath sounds. No stridor. No decreased breath  sounds, wheezing, rhonchi or rales.  Abdominal:     General: There is distension.     Palpations: Abdomen is soft.     Tenderness: There is no abdominal tenderness. There is no rebound.     Comments: Abdominal wall hernia appreciated.  Musculoskeletal:        General: No tenderness. Normal range of motion.     Cervical back: Normal range of motion and neck supple.     Left hip: No deformity. Normal range of motion.  Skin:    General: Skin is warm and dry.     Findings: No abrasion or rash.  Neurological:     Mental Status: She is alert and oriented to person, place, and time. Mental status is at baseline.     GCS: GCS eye subscore is 4. GCS verbal subscore is 5. GCS motor subscore is 6.     Cranial Nerves: Cranial nerves are intact and 2-12 are intact. No cranial nerve deficit.     Sensory: No sensory deficit.     Motor: Motor function is intact.     Gait: Gait is intact.  Psychiatric:        Attention  and Perception: Attention normal.        Speech: Speech normal.        Behavior: Behavior normal.     ED Results / Procedures / Treatments   Labs (all labs ordered are listed, but only abnormal results are displayed) Labs Reviewed  CBC WITH DIFFERENTIAL/PLATELET  COMPREHENSIVE METABOLIC PANEL  URINALYSIS, ROUTINE W REFLEX MICROSCOPIC    EKG None  Radiology No results found.  Procedures Procedures    Medications Ordered in ED Medications  lactated ringers infusion (has no administration in time range)    ED Course/ Medical Decision Making/ A&P                                 Medical Decision Making Amount and/or Complexity of Data Reviewed Labs: ordered. Radiology: ordered. ECG/medicine tests: ordered.  Risk Prescription drug management.   Patient's electrolytes here are within normal limits.  CBC without acute findings.  Left hip and pelvis negative.  Patient able to ambulate here without assistance.  Chest x-ray also for interpretation shows no acute  findings.  Patient blood pressure stable here although elevated.  Did have manual taken by nursing staff which shows BP 152/90.  Will start on antihypertensive and give referral to the free clinic        Final Clinical Impression(s) / ED Diagnoses Final diagnoses:  None    Rx / DC Orders ED Discharge Orders     None         Lorre Nick, MD 06/01/23 1423

## 2023-06-01 NOTE — ED Triage Notes (Signed)
Pt BIB [facility] and presents left pain. Pt reported to [facility] that she feels "hot" on the inside.  Patient lives at home and hasn't seen a doctor since before COVID. Denies allergies to anything.  [facility] noted an distended bowel but patient reports she has a hernia and her abd is normal for. She also states that her urine has been foul smelling for couple of months and the only thing bothering her today is her left hip.   Pt a/o x 4. [facility] states she was able to ambulate for them. She at baseline has a tremor.

## 2023-06-01 NOTE — ED Notes (Signed)
Pt in X ray

## 2023-06-09 ENCOUNTER — Telehealth: Payer: Self-pay | Admitting: *Deleted

## 2023-06-09 NOTE — Telephone Encounter (Signed)
Transition Care Management Unsuccessful Follow-up Telephone Call  Date of discharge and from where:  [place] ed 06/01/2023  Attempts:  1st Attempt  Reason for unsuccessful TCM follow-up call:  No answer/busy

## 2023-06-10 ENCOUNTER — Telehealth: Payer: Self-pay | Admitting: *Deleted

## 2023-06-10 NOTE — Telephone Encounter (Signed)
Transition Care Management Unsuccessful Follow-up Telephone Call  Date of discharge and from where:  [facility]01/20[facility]  Attempts:  2nd Attempt  Reason for unsuccessful TCM follow-up call:  Unable to reach patient Anna Barrera up on caller

## 2023-11-27 ENCOUNTER — Emergency Department (HOSPITAL_COMMUNITY)
Admission: EM | Admit: 2023-11-27 | Discharge: 2023-11-27 | Disposition: A | Discharge status: Home / Self Care | Payer: Medicare HMO

## 2023-11-27 DIAGNOSIS — R0689 Other abnormalities of breathing: Secondary | ICD-10-CM | POA: Diagnosis not present

## 2023-11-27 DIAGNOSIS — J449 Chronic obstructive pulmonary disease, unspecified: Secondary | ICD-10-CM | POA: Insufficient documentation

## 2023-11-27 DIAGNOSIS — I10 Essential (primary) hypertension: Secondary | ICD-10-CM | POA: Insufficient documentation

## 2023-11-27 DIAGNOSIS — R0902 Hypoxemia: Secondary | ICD-10-CM | POA: Diagnosis not present

## 2023-11-27 DIAGNOSIS — T7421XA Adult sexual abuse, confirmed, initial encounter: Secondary | ICD-10-CM | POA: Diagnosis not present

## 2023-11-27 DIAGNOSIS — N390 Urinary tract infection, site not specified: Secondary | ICD-10-CM | POA: Insufficient documentation

## 2023-11-27 DIAGNOSIS — Z85828 Personal history of other malignant neoplasm of skin: Secondary | ICD-10-CM | POA: Insufficient documentation

## 2023-11-27 DIAGNOSIS — Z79899 Other long term (current) drug therapy: Secondary | ICD-10-CM | POA: Diagnosis not present

## 2023-11-27 DIAGNOSIS — T7621XA Adult sexual abuse, suspected, initial encounter: Secondary | ICD-10-CM | POA: Diagnosis not present

## 2023-11-27 LAB — COMPREHENSIVE METABOLIC PANEL
ALT: 15 U/L (ref 0–44)
AST: 21 U/L (ref 15–41)
Albumin: 3.4 g/dL — ABNORMAL LOW (ref 3.5–5.0)
Alkaline Phosphatase: 70 U/L (ref 38–126)
Anion gap: 13 (ref 5–15)
BUN: 8 mg/dL (ref 8–23)
CO2: 24 mmol/L (ref 22–32)
Calcium: 8.5 mg/dL — ABNORMAL LOW (ref 8.9–10.3)
Chloride: 103 mmol/L (ref 98–111)
Creatinine, Ser: 0.96 mg/dL (ref 0.44–1.00)
GFR, Estimated: >60 mL/min (ref >60)
Glucose, Bld: 115 mg/dL — ABNORMAL HIGH (ref 70–99)
Potassium: 2.7 mmol/L — CL (ref 3.5–5.1)
Sodium: 140 mmol/L (ref 135–145)
Total Bilirubin: 0.7 mg/dL (ref 0.0–1.2)
Total Protein: 6.4 g/dL — ABNORMAL LOW (ref 6.5–8.1)

## 2023-11-27 LAB — RAPID HIV SCREEN (HIV 1/2 AB+AG)
HIV 1/2 Antibodies: NONREACTIVE
HIV-1 P24 Antigen - HIV24: NONREACTIVE

## 2023-11-27 LAB — CBC
HCT: 38.2 % (ref 36.0–46.0)
Hemoglobin: 13 g/dL (ref 12.0–15.0)
MCH: 32.6 pg (ref 26.0–34.0)
MCHC: 34 g/dL (ref 30.0–36.0)
MCV: 95.7 fL (ref 80.0–100.0)
Platelets: 245 10*3/uL (ref 150–400)
RBC: 3.99 MIL/uL (ref 3.87–5.11)
RDW: 13.1 % (ref 11.5–15.5)
WBC: 11.4 10*3/uL — ABNORMAL HIGH (ref 4.0–10.5)
nRBC: 0 % (ref 0.0–0.2)

## 2023-11-27 LAB — URINALYSIS, W/ REFLEX TO CULTURE (INFECTION SUSPECTED)
Bilirubin Urine: NEGATIVE
Glucose, UA: NEGATIVE mg/dL
Ketones, ur: NEGATIVE mg/dL
Nitrite: POSITIVE — AB
Protein, ur: NEGATIVE mg/dL
Specific Gravity, Urine: 1.003 — ABNORMAL LOW (ref 1.005–1.030)
WBC, UA: >50 WBC/hpf (ref 0–5)
pH: 6 (ref 5.0–8.0)

## 2023-11-27 LAB — CBG MONITORING, ED: Glucose-Capillary: 113 mg/dL — ABNORMAL HIGH (ref 70–99)

## 2023-11-27 LAB — HEPATITIS C ANTIBODY: HCV Ab: NONREACTIVE

## 2023-11-27 LAB — AMMONIA: Ammonia: 27 umol/L (ref 9–35)

## 2023-11-27 LAB — HEPATITIS B SURFACE ANTIGEN: Hepatitis B Surface Ag: NONREACTIVE

## 2023-11-27 MED ORDER — POTASSIUM CHLORIDE CRYS ER 20 MEQ PO TBCR
40.0000 meq | EXTENDED_RELEASE_TABLET | Freq: Once | ORAL | Status: AC
  Administered 2023-11-27: 40 meq via ORAL
  Filled 2023-11-27: qty 2

## 2023-11-27 MED ORDER — BICTEGRAVIR-EMTRICITAB-TENOFOV 50-200-25 MG PO TABS: 1.0000 | ORAL_TABLET | Freq: Every day | ORAL | 0 refills | Status: AC

## 2023-11-27 MED ORDER — POTASSIUM CHLORIDE 10 MEQ/100ML IV SOLN
10.0000 meq | INTRAVENOUS | Status: AC
  Administered 2023-11-27 (×2): 10 meq via INTRAVENOUS
  Filled 2023-11-27 (×2): qty 100

## 2023-11-27 MED ORDER — METRONIDAZOLE 500 MG PO TABS
2000.0000 mg | ORAL_TABLET | Freq: Once | ORAL | Status: AC
  Administered 2023-11-27: 2000 mg via ORAL
  Filled 2023-11-27: qty 4

## 2023-11-27 MED ORDER — CEFTRIAXONE SODIUM 500 MG IJ SOLR
500.0000 mg | Freq: Once | INTRAMUSCULAR | Status: AC
  Administered 2023-11-27: 500 mg via INTRAMUSCULAR
  Filled 2023-11-27: qty 500

## 2023-11-27 MED ORDER — DEXTROSE 50 % IV SOLN: 1.0000 | Freq: Once | INTRAVENOUS | Status: DC

## 2023-11-27 MED ORDER — POTASSIUM CHLORIDE 2 MEQ/ML IV SOLN
INTRAVENOUS | Status: DC
  Filled 2023-11-27 (×2): qty 1000

## 2023-11-27 MED ORDER — POTASSIUM CHLORIDE CRYS ER 20 MEQ PO TBCR: 40.0000 meq | EXTENDED_RELEASE_TABLET | Freq: Every day | ORAL | 0 refills | Status: AC

## 2023-11-27 MED ORDER — BICTEGRAVIR-EMTRICITAB-TENOFOV 50-200-25 MG PO PREPACK: 1.0000 | ORAL_TABLET | Freq: Every day | ORAL | Status: DC

## 2023-11-27 MED ORDER — LIDOCAINE HCL (PF) 1 % IJ SOLN
1.0000 mL | Freq: Once | INTRAMUSCULAR | Status: AC
  Administered 2023-11-27: 1 mL
  Filled 2023-11-27: qty 5

## 2023-11-27 MED ORDER — SODIUM CHLORIDE 0.9% FLUSH: 3.0000 mL | INTRAVENOUS | Status: DC | PRN

## 2023-11-27 MED ORDER — SODIUM CHLORIDE 0.9% FLUSH: 3.0000 mL | Freq: Two times a day (BID) | INTRAVENOUS | Status: DC

## 2023-11-27 MED ORDER — CEPHALEXIN 500 MG PO CAPS: 500.0000 mg | ORAL_CAPSULE | Freq: Four times a day (QID) | ORAL | 0 refills | Status: AC

## 2023-11-27 MED ORDER — AZITHROMYCIN 250 MG PO TABS
1000.0000 mg | ORAL_TABLET | Freq: Once | ORAL | Status: AC
  Administered 2023-11-27: 1000 mg via ORAL
  Filled 2023-11-27: qty 4

## 2023-11-27 MED ORDER — CEPHALEXIN 500 MG PO CAPS: 1000.0000 mg | ORAL_CAPSULE | Freq: Two times a day (BID) | ORAL | 0 refills | Status: AC

## 2023-11-27 MED ORDER — SODIUM CHLORIDE 0.9 % IV SOLN: 250.0000 mL | INTRAVENOUS | Status: DC | PRN

## 2023-11-27 NOTE — Discharge Instructions (Addendum)
It was a pleasure taking part in your care.  As we discussed, you have a UTI and your potassium is low.  Please take 40 mEq of oral potassium once a day for the next 5 days.  Please also begin taking Keflex 500 mg 4 times a day for 7 days.  Please follow-up with your PCP and repeat a basic metabolic panel to ensure your potassium levels have normalized.  Please return to the ED with any new or worsening symptoms.
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
## 2023-11-28 LAB — RPR: RPR Ser Ql: NONREACTIVE

## 2023-11-29 LAB — URINE CULTURE: Culture: >=100000 — AB

## 2023-11-30 ENCOUNTER — Telehealth (HOSPITAL_BASED_OUTPATIENT_CLINIC_OR_DEPARTMENT_OTHER): Payer: Self-pay | Admitting: *Deleted

## 2023-11-30 NOTE — Telephone Encounter (Signed)
Post ED Visit - Positive Culture Follow-up  Culture report reviewed by antimicrobial stewardship pharmacist: [facility] []  Enzo Bi, Pharm.D. []  Celedonio Miyamoto, Pharm.D., BCPS AQ-ID []  Garvin Fila, Pharm.D., BCPS []  Georgina Pillion, [address].D., BCPS []  [place], [address].D., BCPS, AAHIVP []  Estella Husk, Pharm.D., BCPS, AAHIVP []  Lysle Pearl, PharmD, BCPS []  Phillips Climes, PharmD, BCPS []  Agapito Games, PharmD, BCPS []  Verlan Friends, PharmD []  Mervyn Gay, PharmD, BCPS [x]  Riccardo Dubin, PharmD  [facility] []  Len Childs, PharmD []  Greer Pickerel, PharmD []  Adalberto Cole, PharmD []  Perlie Gold, Rph []  Lonell Face) Jean Rosenthal, PharmD []  Earl Many, PharmD []  Junita Push, PharmD []  Dorna Leitz, PharmD []  Terrilee Files, PharmD []  Lynann Beaver, PharmD []  Keturah Barre, PharmD []  Loralee Pacas, PharmD []  Bernadene Person, PharmD   Positive urine culture Treated with Cephalexin, organism sensitive to the same and no further patient follow-up is required at this time.  Patsey Berthold 11/30/2023, 1:31 PM

## 2023-12-01 LAB — GC/CHLAMYDIA PROBE AMP (~~LOC~~) NOT AT ARMC
Chlamydia: NEGATIVE
Comment: NEGATIVE
Comment: NORMAL
Neisseria Gonorrhea: NEGATIVE

## 2023-12-07 ENCOUNTER — Telehealth: Payer: Self-pay

## 2023-12-07 NOTE — Telephone Encounter (Signed)
 Patients brother called in to state that [facility]  does not have b[facility]- requests to call it in to [facility] on [address]. Called medication in as written to [facility]
